# Patient Record
Sex: Male | Born: 1945 | Race: White | Hispanic: No | Marital: Single | Smoking: Former smoker
Health system: Southern US, Community
[De-identification: ages and names within clinical notes are randomized; demographics above are authoritative.]

## PROBLEM LIST (undated history)

## (undated) ENCOUNTER — Emergency Department: Admission: EM | Source: Home / Self Care

## (undated) DIAGNOSIS — I2510 Atherosclerotic heart disease of native coronary artery without angina pectoris: Secondary | ICD-10-CM

## (undated) DIAGNOSIS — M26621 Arthralgia of right temporomandibular joint: Secondary | ICD-10-CM

## (undated) DIAGNOSIS — C4490 Unspecified malignant neoplasm of skin, unspecified: Secondary | ICD-10-CM

## (undated) DIAGNOSIS — C61 Malignant neoplasm of prostate: Secondary | ICD-10-CM

## (undated) DIAGNOSIS — R05 Cough: Secondary | ICD-10-CM

## (undated) DIAGNOSIS — E785 Hyperlipidemia, unspecified: Secondary | ICD-10-CM

## (undated) DIAGNOSIS — J042 Acute laryngotracheitis: Secondary | ICD-10-CM

## (undated) DIAGNOSIS — H9190 Unspecified hearing loss, unspecified ear: Secondary | ICD-10-CM

## (undated) DIAGNOSIS — E279 Disorder of adrenal gland, unspecified: Secondary | ICD-10-CM

## (undated) DIAGNOSIS — R41840 Attention and concentration deficit: Secondary | ICD-10-CM

## (undated) DIAGNOSIS — T8859XA Other complications of anesthesia, initial encounter: Secondary | ICD-10-CM

## (undated) DIAGNOSIS — K2970 Gastritis, unspecified, without bleeding: Secondary | ICD-10-CM

## (undated) DIAGNOSIS — M21371 Foot drop, right foot: Secondary | ICD-10-CM

## (undated) DIAGNOSIS — C801 Malignant (primary) neoplasm, unspecified: Secondary | ICD-10-CM

## (undated) DIAGNOSIS — K219 Gastro-esophageal reflux disease without esophagitis: Secondary | ICD-10-CM

## (undated) DIAGNOSIS — Z131 Encounter for screening for diabetes mellitus: Secondary | ICD-10-CM

## (undated) DIAGNOSIS — N200 Calculus of kidney: Secondary | ICD-10-CM

## (undated) DIAGNOSIS — I10 Essential (primary) hypertension: Secondary | ICD-10-CM

## (undated) DIAGNOSIS — G4730 Sleep apnea, unspecified: Secondary | ICD-10-CM

## (undated) DIAGNOSIS — T4145XA Adverse effect of unspecified anesthetic, initial encounter: Secondary | ICD-10-CM

## (undated) DIAGNOSIS — R059 Cough, unspecified: Secondary | ICD-10-CM

## (undated) HISTORY — DX: Unspecified hearing loss, unspecified ear: H91.90

## (undated) HISTORY — DX: Acute laryngotracheitis: J04.2

## (undated) HISTORY — DX: Hyperlipidemia, unspecified: E78.5

## (undated) HISTORY — PX: OTHER SURGICAL HISTORY: SHX169

## (undated) HISTORY — DX: Essential (primary) hypertension: I10

## (undated) HISTORY — DX: Sleep apnea, unspecified: G47.30

## (undated) HISTORY — DX: Gastritis, unspecified, without bleeding: K29.70

## (undated) HISTORY — DX: Unspecified malignant neoplasm of skin, unspecified: C44.90

## (undated) HISTORY — DX: Atherosclerotic heart disease of native coronary artery without angina pectoris: I25.10

## (undated) HISTORY — DX: Arthralgia of right temporomandibular joint: M26.621

## (undated) HISTORY — DX: Disorder of adrenal gland, unspecified: E27.9

## (undated) HISTORY — DX: Encounter for screening for diabetes mellitus: Z13.1

## (undated) HISTORY — DX: Attention and concentration deficit: R41.840

---
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
## 1975-11-13 HISTORY — PX: CERVICAL LAMINECTOMY: SHX94

## 1985-11-12 HISTORY — PX: CYSTOSCOPY: SUR368

## 1996-11-12 HISTORY — PX: CARDIAC CATHETERIZATION: SHX172

## 2003-11-13 DIAGNOSIS — C801 Malignant (primary) neoplasm, unspecified: Secondary | ICD-10-CM

## 2003-11-13 HISTORY — DX: Malignant (primary) neoplasm, unspecified: C80.1

## 2005-05-30 ENCOUNTER — Ambulatory Visit: Payer: Self-pay | Admitting: Family Medicine

## 2005-07-17 ENCOUNTER — Ambulatory Visit: Payer: Self-pay | Admitting: Family Medicine

## 2005-07-18 ENCOUNTER — Ambulatory Visit: Payer: Self-pay | Admitting: Family Medicine

## 2009-11-12 HISTORY — PX: LAMINECTOMY: SHX219

## 2010-05-02 ENCOUNTER — Encounter (INDEPENDENT_AMBULATORY_CARE_PROVIDER_SITE_OTHER): Payer: Self-pay | Admitting: Orthopedic Surgery

## 2010-05-02 ENCOUNTER — Observation Stay (HOSPITAL_COMMUNITY): Admission: RE | Admit: 2010-05-02 | Discharge: 2010-05-03 | Payer: Self-pay | Admitting: Orthopedic Surgery

## 2011-01-04 IMAGING — CR DG CHEST 2V
2 series · 2 of 2 positions shown · non-contrast
Comparison: None.

CLINICAL DATA: Preop for lumbar surgery

CHEST - 2 VIEW

[w chest pa]
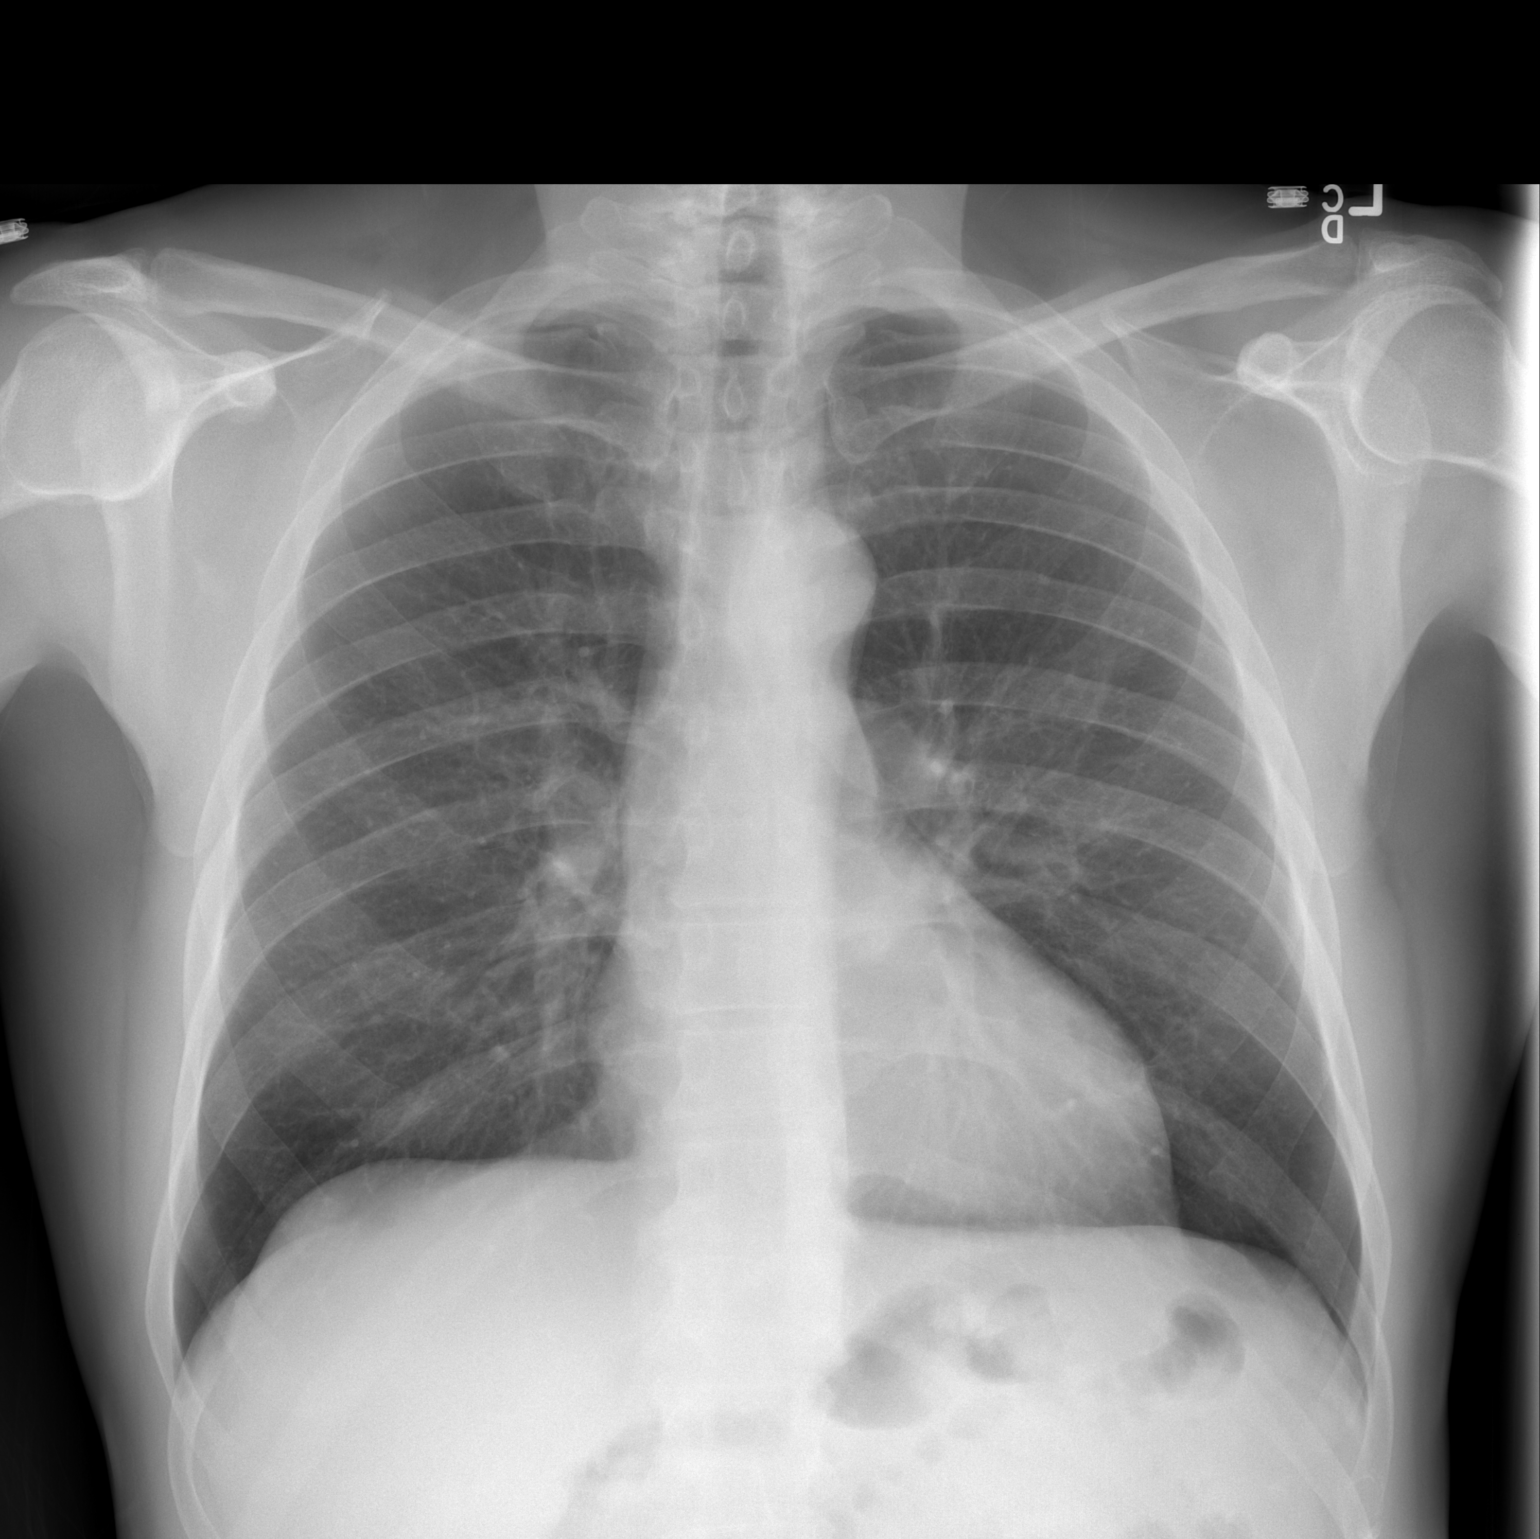

[w chest lat]
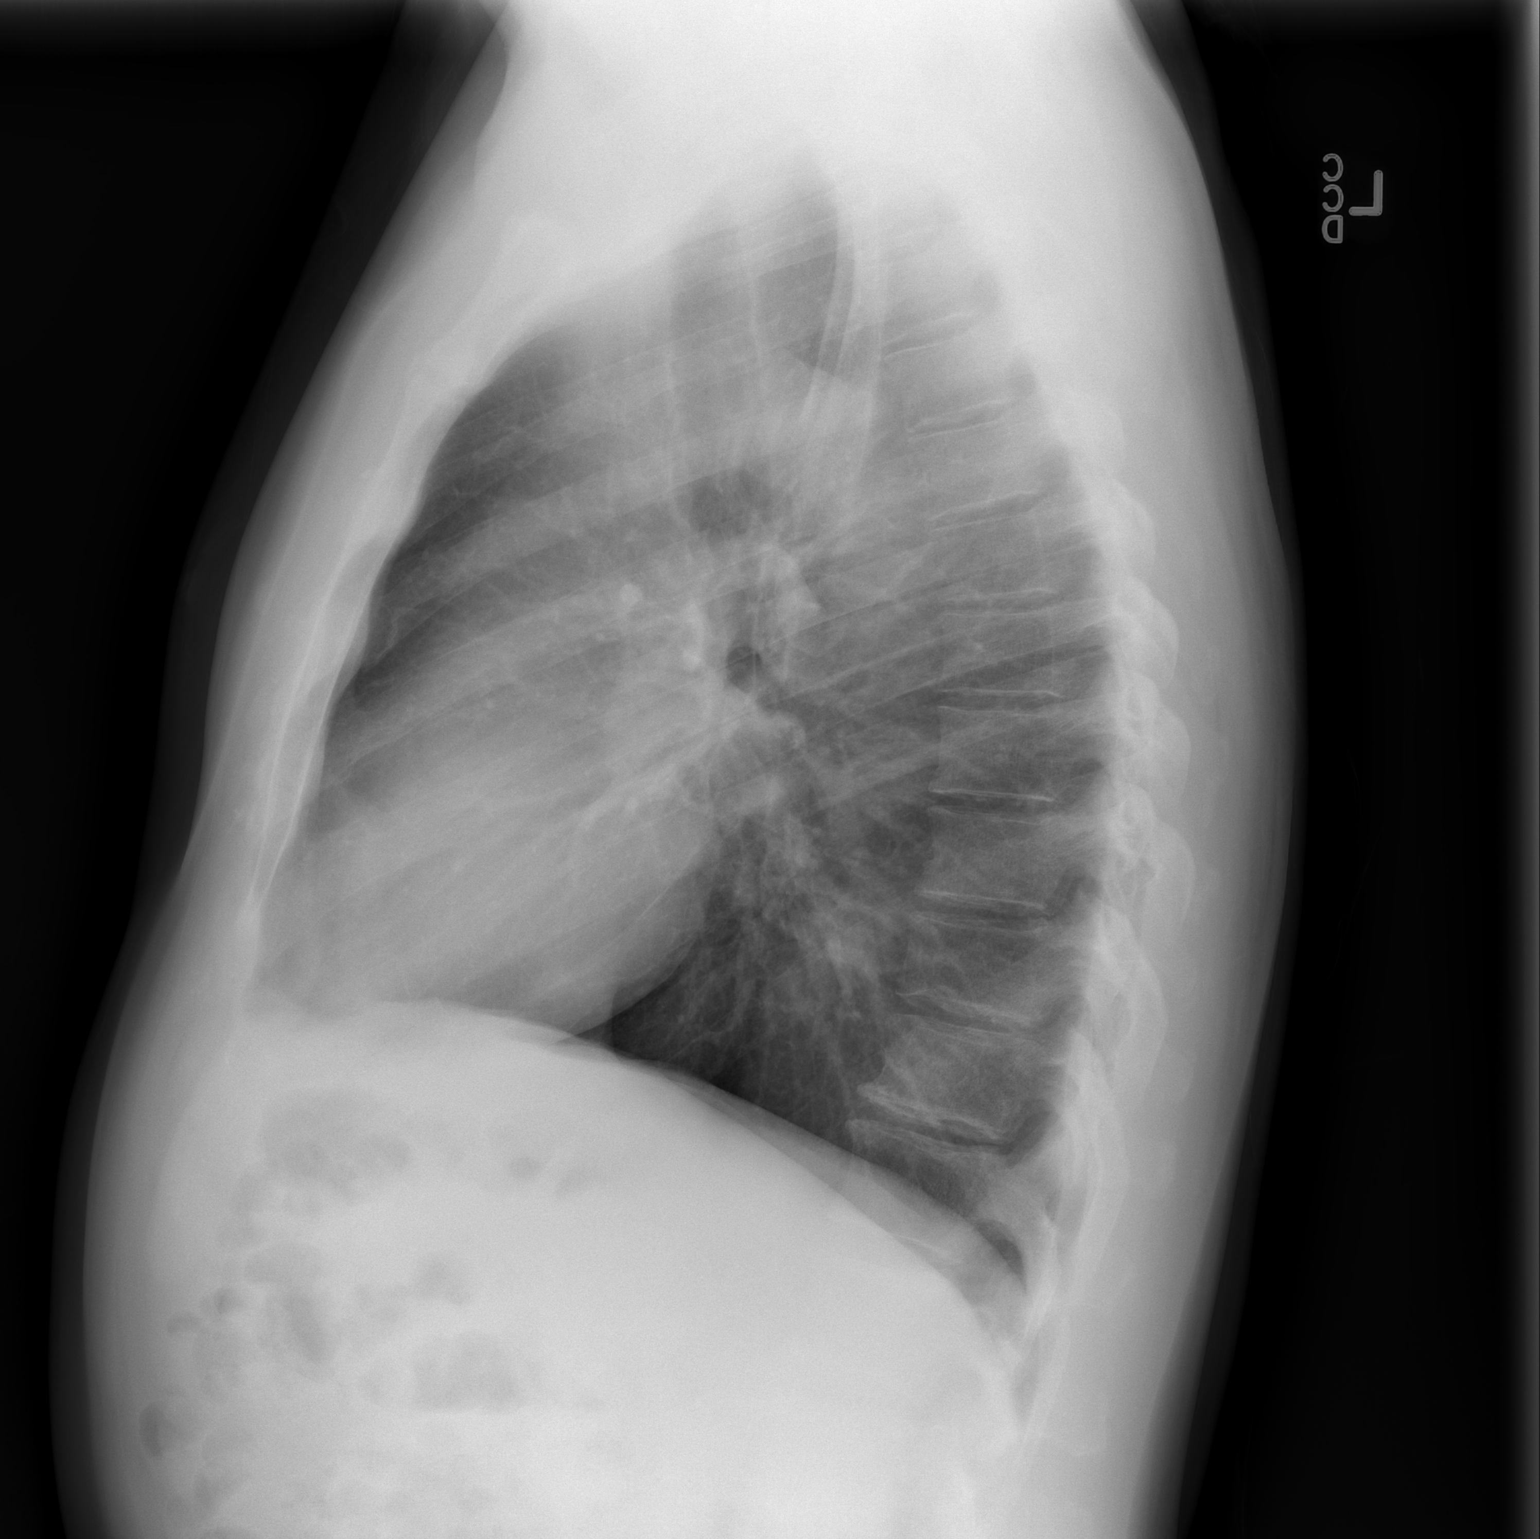

[2 of 2 positions shown; findings below may reference images not displayed]

FINDINGS: Cardiomediastinal silhouette is unremarkable.  No acute
infiltrate or pleural effusion.  No pulmonary edema.  Mild
degenerative changes are noted lower thoracic spine.
IMPRESSION: No active disease.  Mild degenerative changes lower thoracic spine.

## 2011-01-04 IMAGING — CR DG OR PORTABLE SPINE
1 series · 1 of 1 positions shown · non-contrast
Comparison: Portable lateral intraoperative lumbar spine earlier
same date 0046 hours.

CLINICAL DATA: L4-5 disc herniation.  Intraoperative localization.

PORTABLE OPERATIVE SPINE 1 VIEW [DATE]/0866 6898 hours:

[view not recorded]
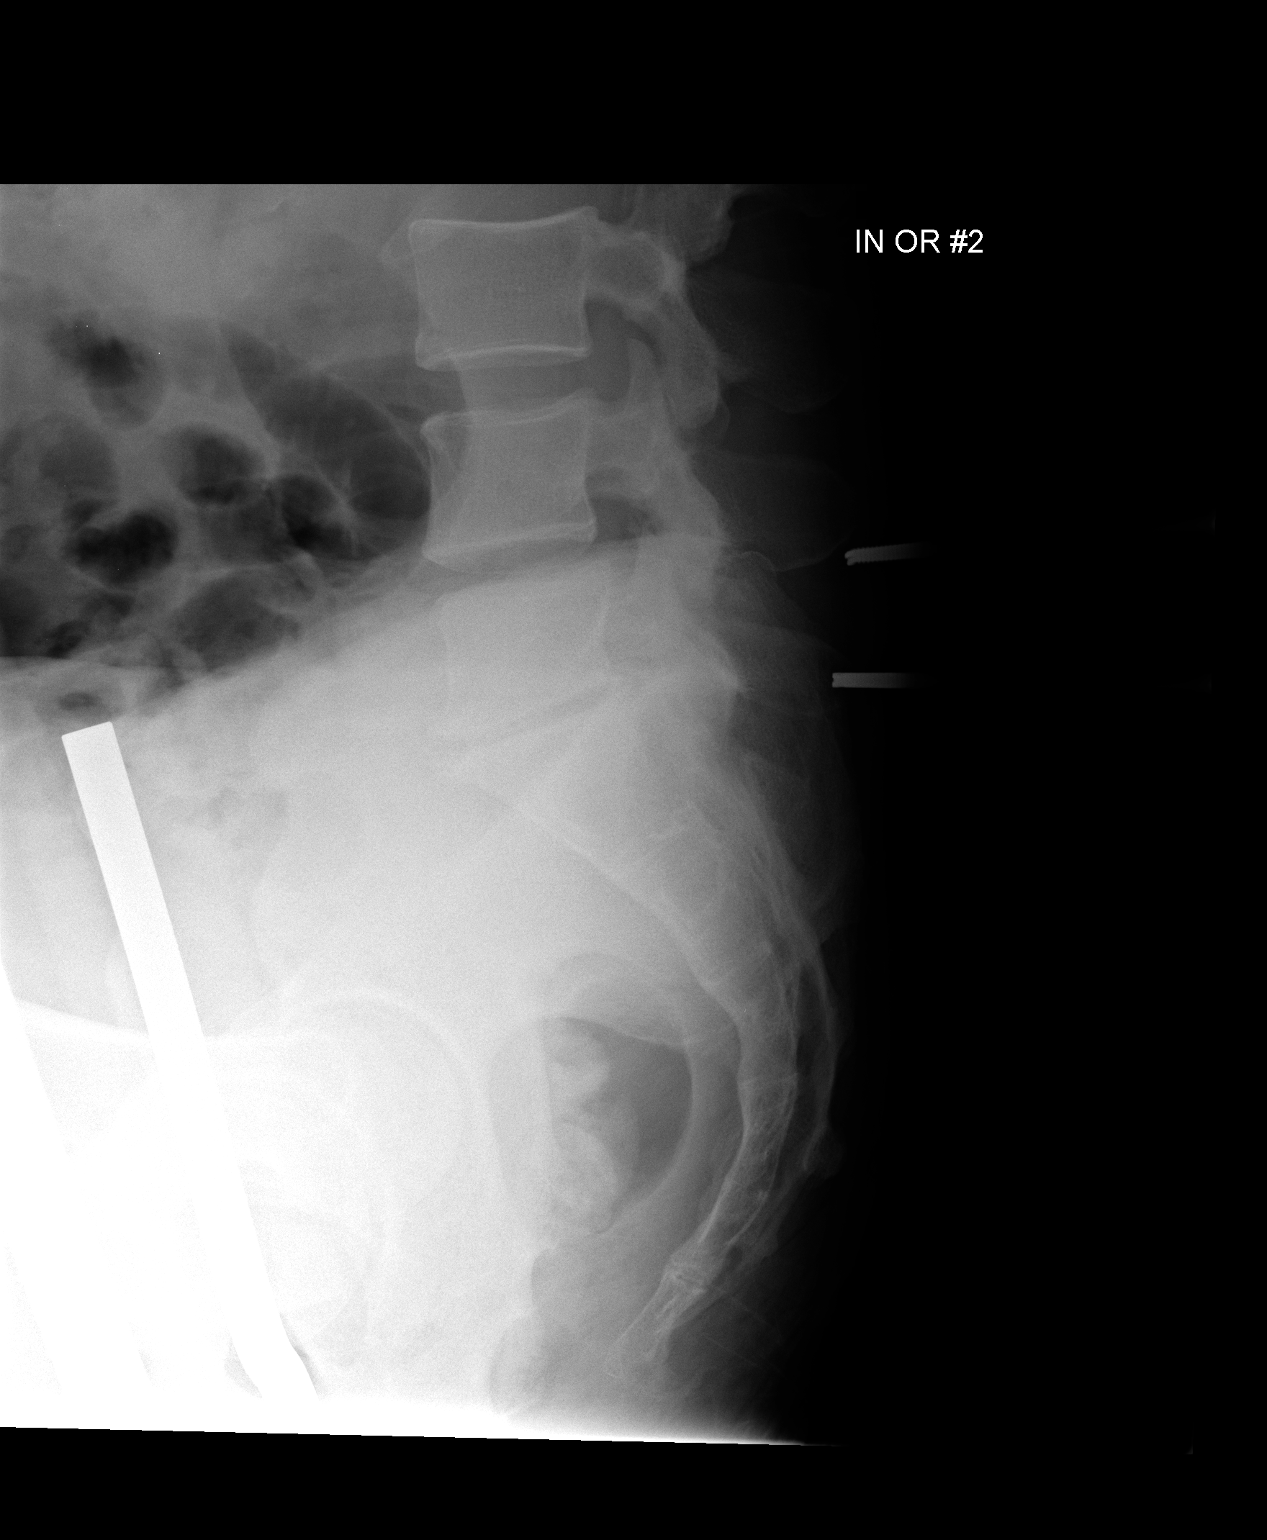

[1 of 1 positions shown; findings below may reference images not displayed]

FINDINGS: Clamps are present on the spinous processes of L4 and L5.
Disc space and endplate hypertrophic changes again noted L5-S1.
IMPRESSION: Clamped on the spinous processes of L4 and L5.

## 2011-01-28 LAB — SURGICAL PCR SCREEN: MRSA, PCR: NEGATIVE

## 2012-03-27 ENCOUNTER — Other Ambulatory Visit: Payer: Self-pay | Admitting: Orthopedic Surgery

## 2012-04-01 ENCOUNTER — Encounter (HOSPITAL_COMMUNITY)
Admission: RE | Admit: 2012-04-01 | Discharge: 2012-04-01 | Disposition: A | Discharge status: Home / Self Care | Payer: PRIVATE HEALTH INSURANCE | Source: Ambulatory Visit | Attending: Orthopedic Surgery | Admitting: Orthopedic Surgery

## 2012-04-01 ENCOUNTER — Ambulatory Visit (HOSPITAL_COMMUNITY)
Admission: RE | Admit: 2012-04-01 | Discharge: 2012-04-01 | Disposition: A | Discharge status: Home / Self Care | Payer: PRIVATE HEALTH INSURANCE | Source: Ambulatory Visit | Attending: Orthopedic Surgery | Admitting: Orthopedic Surgery

## 2012-04-01 ENCOUNTER — Encounter (HOSPITAL_COMMUNITY): Payer: Self-pay

## 2012-04-01 ENCOUNTER — Encounter (HOSPITAL_COMMUNITY): Payer: Self-pay | Admitting: Pharmacy Technician

## 2012-04-01 DIAGNOSIS — Z01810 Encounter for preprocedural cardiovascular examination: Secondary | ICD-10-CM | POA: Insufficient documentation

## 2012-04-01 DIAGNOSIS — Z01812 Encounter for preprocedural laboratory examination: Secondary | ICD-10-CM | POA: Insufficient documentation

## 2012-04-01 DIAGNOSIS — Z01818 Encounter for other preprocedural examination: Secondary | ICD-10-CM | POA: Insufficient documentation

## 2012-04-01 HISTORY — DX: Other complications of anesthesia, initial encounter: T88.59XA

## 2012-04-01 HISTORY — DX: Gastro-esophageal reflux disease without esophagitis: K21.9

## 2012-04-01 HISTORY — DX: Cough, unspecified: R05.9

## 2012-04-01 HISTORY — DX: Cough: R05

## 2012-04-01 HISTORY — DX: Calculus of kidney: N20.0

## 2012-04-01 HISTORY — DX: Malignant (primary) neoplasm, unspecified: C80.1

## 2012-04-01 HISTORY — DX: Adverse effect of unspecified anesthetic, initial encounter: T41.45XA

## 2012-04-01 HISTORY — DX: Foot drop, right foot: M21.371

## 2012-04-01 LAB — CBC
MCH: 32.9 pg (ref 26.0–34.0)
MCHC: 35.1 g/dL (ref 30.0–36.0)
MCV: 93.7 fL (ref 78.0–100.0)
Platelets: 237 10*3/uL (ref 150–400)
RDW: 12.5 % (ref 11.5–15.5)
WBC: 7.2 10*3/uL (ref 4.0–10.5)

## 2012-04-01 LAB — SURGICAL PCR SCREEN: MRSA, PCR: NEGATIVE

## 2012-04-01 NOTE — Patient Instructions (Addendum)
20 Brad Hamilton  04/01/2012   Your procedure is scheduled on:  04-03-2012  Report to [facility] at 145 pm  Call this number if you have problems the morning of surgery: [phone]   Remember:   Do not eat food:After Midnight.   clear liquids midnight until 1015am, then nothing by mouth.  Take these medicines the morning of surgery with A SIP OF WATER: bupropion   Do not wear jewelry or make up.  Do not wear lotions, powders, or perfumes.Do not wear deodorant.    Do not bring valuables to the hospital.  Contacts, dentures or bridgework may not be worn into surgery.  Leave suitcase in the car. After surgery it may be brought to your room.  For patients admitted to the hospital, checkout time is 11:00 AM the day of discharge.     Special Instructions: CHG Shower Use Special Wash: 1/2 bottle night before surgery and 1/2 bottle morning of surgery.neck down avoid private area   Please read over the following fact sheets that you were given: MRSA Information  [facility] pre op nurse phone number [phone], call if needed

## 2012-04-03 ENCOUNTER — Encounter (HOSPITAL_COMMUNITY): Payer: Self-pay | Admitting: Anesthesiology

## 2012-04-03 ENCOUNTER — Ambulatory Visit (HOSPITAL_COMMUNITY): Payer: No Typology Code available for payment source | Admitting: Anesthesiology

## 2012-04-03 ENCOUNTER — Ambulatory Visit (HOSPITAL_COMMUNITY): Payer: No Typology Code available for payment source

## 2012-04-03 ENCOUNTER — Encounter (HOSPITAL_COMMUNITY): Payer: Self-pay | Admitting: *Deleted

## 2012-04-03 ENCOUNTER — Observation Stay (HOSPITAL_COMMUNITY)
Admission: RE | Admit: 2012-04-03 | Discharge: 2012-04-04 | Disposition: A | Discharge status: Home / Self Care | Payer: No Typology Code available for payment source | Source: Ambulatory Visit | Attending: Orthopedic Surgery | Admitting: Orthopedic Surgery

## 2012-04-03 ENCOUNTER — Encounter (HOSPITAL_COMMUNITY)
Admission: RE | Disposition: A | Discharge status: Home / Self Care | Payer: Self-pay | Source: Ambulatory Visit | Attending: Orthopedic Surgery

## 2012-04-03 DIAGNOSIS — Z79899 Other long term (current) drug therapy: Secondary | ICD-10-CM | POA: Insufficient documentation

## 2012-04-03 DIAGNOSIS — M5126 Other intervertebral disc displacement, lumbar region: Principal | ICD-10-CM | POA: Insufficient documentation

## 2012-04-03 DIAGNOSIS — M216X9 Other acquired deformities of unspecified foot: Secondary | ICD-10-CM | POA: Insufficient documentation

## 2012-04-03 DIAGNOSIS — Z7982 Long term (current) use of aspirin: Secondary | ICD-10-CM | POA: Insufficient documentation

## 2012-04-03 DIAGNOSIS — K219 Gastro-esophageal reflux disease without esophagitis: Secondary | ICD-10-CM | POA: Insufficient documentation

## 2012-04-03 DIAGNOSIS — Z9889 Other specified postprocedural states: Secondary | ICD-10-CM

## 2012-04-03 HISTORY — PX: LUMBAR LAMINECTOMY/DECOMPRESSION MICRODISCECTOMY: SHX5026

## 2012-04-03 SURGERY — LUMBAR LAMINECTOMY/DECOMPRESSION MICRODISCECTOMY 1 LEVEL
Anesthesia: General | Site: Back | Laterality: Right | Wound class: Clean

## 2012-04-03 MED ORDER — HYDROMORPHONE HCL PF 1 MG/ML IJ SOLN
INTRAMUSCULAR | Status: AC
  Filled 2012-04-03: qty 1

## 2012-04-03 MED ORDER — MIDAZOLAM HCL 5 MG/5ML IJ SOLN
INTRAMUSCULAR | Status: DC | PRN
  Administered 2012-04-03: 2 mg via INTRAVENOUS

## 2012-04-03 MED ORDER — HYDROMORPHONE 0.3 MG/ML IV SOLN
INTRAVENOUS | Status: AC
  Filled 2012-04-03: qty 25

## 2012-04-03 MED ORDER — PROPOFOL 10 MG/ML IV EMUL
INTRAVENOUS | Status: DC | PRN
  Administered 2012-04-03: 180 mg via INTRAVENOUS

## 2012-04-03 MED ORDER — LACTATED RINGERS IV SOLN
INTRAVENOUS | Status: DC
  Administered 2012-04-03: 20:00:00 via INTRAVENOUS
  Administered 2012-04-03: 1000 mL via INTRAVENOUS

## 2012-04-03 MED ORDER — DIHYDROXYALUMINUM SOD CARB 334 MG PO CHEW: 1.0000 | CHEWABLE_TABLET | ORAL | Status: DC | PRN

## 2012-04-03 MED ORDER — ACETAMINOPHEN 500 MG PO TABS: 1000.0000 mg | ORAL_TABLET | Freq: Three times a day (TID) | ORAL | Status: DC | PRN

## 2012-04-03 MED ORDER — FENTANYL CITRATE 0.05 MG/ML IJ SOLN
INTRAMUSCULAR | Status: DC | PRN
  Administered 2012-04-03 (×5): 50 ug via INTRAVENOUS

## 2012-04-03 MED ORDER — NALOXONE HCL 0.4 MG/ML IJ SOLN: 0.4000 mg | INTRAMUSCULAR | Status: DC | PRN

## 2012-04-03 MED ORDER — CEFAZOLIN SODIUM 1-5 GM-% IV SOLN
1.0000 g | INTRAVENOUS | Status: AC
  Administered 2012-04-03: 1 g via INTRAVENOUS

## 2012-04-03 MED ORDER — ONDANSETRON HCL 4 MG/2ML IJ SOLN: 4.0000 mg | Freq: Four times a day (QID) | INTRAMUSCULAR | Status: DC | PRN

## 2012-04-03 MED ORDER — HYDROMORPHONE HCL PF 1 MG/ML IJ SOLN
0.2500 mg | INTRAMUSCULAR | Status: DC | PRN
  Administered 2012-04-03: 0.5 mg via INTRAVENOUS

## 2012-04-03 MED ORDER — THROMBIN 5000 UNITS EX SOLR
CUTANEOUS | Status: DC | PRN
  Administered 2012-04-03: 19:00:00 via TOPICAL

## 2012-04-03 MED ORDER — CEFAZOLIN SODIUM 1-5 GM-% IV SOLN
1.0000 g | Freq: Once | INTRAVENOUS | Status: AC
  Administered 2012-04-03: 1 g via INTRAVENOUS
  Filled 2012-04-03: qty 50

## 2012-04-03 MED ORDER — LIDOCAINE HCL (CARDIAC) 20 MG/ML IV SOLN
INTRAVENOUS | Status: DC | PRN
  Administered 2012-04-03: 100 mg via INTRAVENOUS

## 2012-04-03 MED ORDER — THROMBIN 5000 UNITS EX SOLR
CUTANEOUS | Status: AC
  Filled 2012-04-03: qty 10000

## 2012-04-03 MED ORDER — ROCURONIUM BROMIDE 100 MG/10ML IV SOLN
INTRAVENOUS | Status: DC | PRN
  Administered 2012-04-03 (×2): 5 mg via INTRAVENOUS
  Administered 2012-04-03: 40 mg via INTRAVENOUS
  Administered 2012-04-03: 5 mg via INTRAVENOUS

## 2012-04-03 MED ORDER — SUCCINYLCHOLINE CHLORIDE 20 MG/ML IJ SOLN
INTRAMUSCULAR | Status: DC | PRN
  Administered 2012-04-03: 100 mg via INTRAVENOUS

## 2012-04-03 MED ORDER — POVIDONE-IODINE 7.5 % EX SOLN: Freq: Once | CUTANEOUS | Status: DC

## 2012-04-03 MED ORDER — CALCIUM CARBONATE ANTACID 500 MG PO CHEW
1.0000 | CHEWABLE_TABLET | ORAL | Status: DC | PRN
  Filled 2012-04-03: qty 1

## 2012-04-03 MED ORDER — 0.9 % SODIUM CHLORIDE (POUR BTL) OPTIME
TOPICAL | Status: DC | PRN
  Administered 2012-04-03: 1000 mL

## 2012-04-03 MED ORDER — DEXAMETHASONE SODIUM PHOSPHATE 10 MG/ML IJ SOLN
INTRAMUSCULAR | Status: DC | PRN
  Administered 2012-04-03: 10 mg via INTRAVENOUS

## 2012-04-03 MED ORDER — NEOSTIGMINE METHYLSULFATE 1 MG/ML IJ SOLN
INTRAMUSCULAR | Status: DC | PRN
  Administered 2012-04-03: 5 mg via INTRAVENOUS

## 2012-04-03 MED ORDER — GLYCOPYRROLATE 0.2 MG/ML IJ SOLN
INTRAMUSCULAR | Status: DC | PRN
  Administered 2012-04-03: 0.2 mg via INTRAVENOUS
  Administered 2012-04-03: .8 mg via INTRAVENOUS

## 2012-04-03 MED ORDER — ACETAMINOPHEN 10 MG/ML IV SOLN
INTRAVENOUS | Status: DC | PRN
  Administered 2012-04-03: 1000 mg via INTRAVENOUS

## 2012-04-03 MED ORDER — VITAMIN C 500 MG PO TABS
500.0000 mg | ORAL_TABLET | Freq: Every day | ORAL | Status: DC
  Administered 2012-04-04: 500 mg via ORAL
  Filled 2012-04-03 (×2): qty 1

## 2012-04-03 MED ORDER — EPHEDRINE SULFATE 50 MG/ML IJ SOLN
INTRAMUSCULAR | Status: DC | PRN
  Administered 2012-04-03: 10 mg via INTRAVENOUS
  Administered 2012-04-03: 5 mg via INTRAVENOUS

## 2012-04-03 MED ORDER — ONDANSETRON HCL 4 MG/2ML IJ SOLN
INTRAMUSCULAR | Status: DC | PRN
  Administered 2012-04-03: 4 mg via INTRAVENOUS

## 2012-04-03 MED ORDER — ACETAMINOPHEN ER 650 MG PO TBCR: 1300.0000 mg | EXTENDED_RELEASE_TABLET | Freq: Three times a day (TID) | ORAL | Status: DC | PRN

## 2012-04-03 MED ORDER — DIPHENHYDRAMINE HCL 50 MG/ML IJ SOLN: 12.5000 mg | Freq: Four times a day (QID) | INTRAMUSCULAR | Status: DC | PRN

## 2012-04-03 MED ORDER — SODIUM CHLORIDE 0.9 % IJ SOLN: 9.0000 mL | INTRAMUSCULAR | Status: DC | PRN

## 2012-04-03 MED ORDER — DIPHENHYDRAMINE HCL 12.5 MG/5ML PO ELIX: 12.5000 mg | ORAL_SOLUTION | Freq: Four times a day (QID) | ORAL | Status: DC | PRN

## 2012-04-03 MED ORDER — HYDROMORPHONE 0.3 MG/ML IV SOLN
INTRAVENOUS | Status: DC
  Administered 2012-04-03: 20:00:00 via INTRAVENOUS

## 2012-04-03 SURGICAL SUPPLY — 39 items
BAG ZIPLOCK 12X15 (MISCELLANEOUS) ×2 IMPLANT
BENZOIN TINCTURE PRP APPL 2/3 (GAUZE/BANDAGES/DRESSINGS) ×2 IMPLANT
BUR EGG ELITE 4.0 (BURR) IMPLANT
CLEANER TIP ELECTROSURG 2X2 (MISCELLANEOUS) ×2 IMPLANT
CLOTH BEACON ORANGE TIMEOUT ST (SAFETY) ×2 IMPLANT
CONT SPEC 4OZ CLIKSEAL STRL BL (MISCELLANEOUS) ×2 IMPLANT
DRAIN PENROSE 18X1/4 LTX STRL (WOUND CARE) IMPLANT
DRAPE MICROSCOPE LEICA (MISCELLANEOUS) ×2 IMPLANT
DRAPE POUCH INSTRU U-SHP 10X18 (DRAPES) ×2 IMPLANT
DRAPE SURG 17X11 SM STRL (DRAPES) ×2 IMPLANT
DRSG ADAPTIC 3X8 NADH LF (GAUZE/BANDAGES/DRESSINGS) ×2 IMPLANT
DRSG PAD ABDOMINAL 8X10 ST (GAUZE/BANDAGES/DRESSINGS) ×2 IMPLANT
DURAPREP 26ML APPLICATOR (WOUND CARE) ×2 IMPLANT
ELECT BLADE TIP CTD 4 INCH (ELECTRODE) ×2 IMPLANT
ELECT REM PT RETURN 9FT ADLT (ELECTROSURGICAL) ×2
ELECTRODE REM PT RTRN 9FT ADLT (ELECTROSURGICAL) ×1 IMPLANT
GLOVE BIO SURGEON STRL SZ8 (GLOVE) ×4 IMPLANT
GLOVE ECLIPSE 8.0 STRL XLNG CF (GLOVE) ×2 IMPLANT
GLOVE INDICATOR 8.0 STRL GRN (GLOVE) ×4 IMPLANT
GOWN STRL REIN XL XLG (GOWN DISPOSABLE) ×2 IMPLANT
KIT BASIN OR (CUSTOM PROCEDURE TRAY) ×2 IMPLANT
KIT POSITIONING SURG ANDREWS (MISCELLANEOUS) ×2 IMPLANT
MANIFOLD NEPTUNE II (INSTRUMENTS) ×2 IMPLANT
NEEDLE SPNL 18GX3.5 QUINCKE PK (NEEDLE) ×6 IMPLANT
NS IRRIG 1000ML POUR BTL (IV SOLUTION) ×2 IMPLANT
PATTIES SURGICAL .5 X.5 (GAUZE/BANDAGES/DRESSINGS) ×4 IMPLANT
PATTIES SURGICAL .75X.75 (GAUZE/BANDAGES/DRESSINGS) ×2 IMPLANT
PATTIES SURGICAL 1X1 (DISPOSABLE) ×2 IMPLANT
POSITIONER SURGICAL ARM (MISCELLANEOUS) ×2 IMPLANT
SPONGE LAP 4X18 X RAY DECT (DISPOSABLE) ×4 IMPLANT
SPONGE SURGIFOAM ABS GEL 100 (HEMOSTASIS) ×2 IMPLANT
STAPLER VISISTAT 35W (STAPLE) ×2 IMPLANT
SUT VIC AB 1 CT1 27 (SUTURE) ×2
SUT VIC AB 1 CT1 27XBRD ANTBC (SUTURE) ×2 IMPLANT
SUT VIC AB 2-0 CT1 27 (SUTURE) ×2
SUT VIC AB 2-0 CT1 27XBRD (SUTURE) ×2 IMPLANT
TOWEL OR 17X26 10 PK STRL BLUE (TOWEL DISPOSABLE) IMPLANT
TRAY LAMINECTOMY (CUSTOM PROCEDURE TRAY) ×2 IMPLANT
WATER STERILE IRR 1500ML POUR (IV SOLUTION) IMPLANT

## 2012-04-03 NOTE — H&P (Signed)
Brad Hamilton is an 66 y.o. male.   Chief Complaint:pain and weakness rt leg/foot HPI: history of microdiscectomy L4-5 left ;now with rt leg pain withw eakness of dorsiflexion foot and extruded disc fragment at L4-5 right  Past Medical History  Diagnosis Date  . Kidney stones 4 yrs ago    passed on own  . GERD (gastroesophageal reflux disease)   . Cancer 2005    skin cancer on abdomen  . Foot drop, right   . Complication of anesthesia     hyper coming out of anesthesia 1987 cystoscopy  . Cough     occasional cough with allergies    Past Surgical History  Procedure Date  . Appendectomy 1958  . Cervical laminectomy 1977  . Sinus surgery x 2 1980's   . Laminectomy 2011  . Cardiac catheterization 1998  . Cystoscopy 1987    History reviewed. No pertinent family history. Social History:  reports that he quit smoking about 11 years ago. He has never used smokeless tobacco. He reports that he drinks about 1.2 ounces of alcohol per week. His drug history not on file.  Allergies: No Known Allergies  Medications Prior to Admission  Medication Sig Dispense Refill  . acetaminophen (TYLENOL) 650 MG CR tablet Take 1,300 mg by mouth every 8 (eight) hours as needed.      Marland Kitchen aspirin EC 81 MG tablet Take 81 mg by mouth daily with breakfast.      . buPROPion (WELLBUTRIN XL) 150 MG 24 hr tablet Take 150 mg by mouth daily with breakfast.      . calcium carbonate-magnesium hydroxide (ROLAIDS) 334 MG CHEW Chew 1 tablet by mouth as needed.      Marland Kitchen ibuprofen (ADVIL,MOTRIN) 200 MG tablet Take 400-600 mg by mouth every 6 (six) hours as needed. For pain      . OVER THE COUNTER MEDICATION Take by mouth daily with breakfast. Alphaloproic Acid=For immune system      . Saw Palmetto 500 MG CAPS Take 500 mg by mouth daily with breakfast.      . vitamin C (ASCORBIC ACID) 500 MG tablet Take 500 mg by mouth daily with breakfast.        No results found for this or any previous visit (from the past 48  hour(s)). No results found.  ROS  Blood pressure 164/92, pulse 60, temperature 97.3 F (36.3 C), temperature source Oral, resp. rate 16, SpO2 98.00%. Physical Exam  Constitutional: He is oriented to person, place, and time. He appears well-developed and well-nourished.  HENT:  Head: Normocephalic and atraumatic.  Right Ear: External ear normal.  Left Ear: External ear normal.  Nose: Nose normal.  Mouth/Throat: Oropharynx is clear and moist.  Eyes: Conjunctivae and EOM are normal. Pupils are equal, round, and reactive to light.  Neck: Normal range of motion. Neck supple.  Cardiovascular: Normal rate, regular rhythm and intact distal pulses.   Respiratory: Effort normal and breath sounds normal.  GI: Soft. Bowel sounds are normal.  Musculoskeletal: Normal range of motion.       Weakness of dorsiflexion rt foot  Neurological: He is alert and oriented to person, place, and time. He has normal reflexes.  Skin: Skin is warm and dry.  Psychiatric: He has a normal mood and affect. His behavior is normal. Judgment and thought content normal.     Assessment/Plan HNP with extruded fragment L4-5 right Microdiscectomy L4-5 rt  [name] P 04/03/2012, 4:11 PM

## 2012-04-03 NOTE — Transfer of Care (Signed)
Immediate Anesthesia Transfer of Care Note  Patient: Brad Hamilton  Procedure(s) Performed: Procedure(s) (LRB): LUMBAR LAMINECTOMY/DECOMPRESSION MICRODISCECTOMY 1 LEVEL (Right)  Patient Location: PACU  Anesthesia Type: General  Level of Consciousness: awake, alert , oriented and patient cooperative  Airway & Oxygen Therapy: Patient Spontanous Breathing and Patient connected to face mask oxygen  Post-op Assessment: Report given to PACU RN, Post -op Vital signs reviewed and stable and Patient moving all extremities  Post vital signs: Reviewed  Complications: No apparent anesthesia complications

## 2012-04-03 NOTE — Brief Op Note (Signed)
04/03/2012  7:15 PM  PATIENT:  Brad Hamilton  66 y.o. male  PRE-OPERATIVE DIAGNOSIS:  Herniated Disc recurrent, L4-5 right  POST-OPERATIVE DIAGNOSIS:  Herniated Disc recurrent L4-5 right  PROCEDURE:  Procedure(s) (LRB): LUMBAR LAMINECTOMY/DECOMPRESSION MICRODISCECTOMY 1 LEVEL (Right) L4-5  SURGEON:  Surgeon(s) and Role:    * Drucilla Schmidt, MD - Primary    * Jacki Cones, MD - Assisting  PHYSICIAN ASSISTANT:   ASSISTANTS: nurse  ANESTHESIA:   general  EBL:  Total I/O In: 800 [I.V.:800] Out: -   BLOOD ADMINISTERED:none  DRAINS: none   LOCAL MEDICATIONS USED:  NONE  SPECIMEN:  Biopsy / Limited Resection  DISPOSITION OF SPECIMEN:  PATHOLOGY  COUNTS:  YES  TOURNIQUET:  * No tourniquets in log *  DICTATION: .Other Dictation: Dictation Number [id number]  PLAN OF CARE: Admit for overnight observation  PATIENT DISPOSITION:  PACU - hemodynamically stable.   Delay start of Pharmacological VTE agent (>24hrs) due to surgical blood loss or risk of bleeding: yes

## 2012-04-03 NOTE — Preoperative (Signed)
Beta Blockers   Reason not to administer Beta Blockers:Not Applicable 

## 2012-04-03 NOTE — Anesthesia Postprocedure Evaluation (Signed)
  Anesthesia Post-op Note  Patient: Brad Hamilton  Procedure(s) Performed: Procedure(s) (LRB): LUMBAR LAMINECTOMY/DECOMPRESSION MICRODISCECTOMY 1 LEVEL (Right)  Patient Location: PACU  Anesthesia Type: General  Level of Consciousness: oriented and sedated  Airway and Oxygen Therapy: Patient Spontanous Breathing and Patient connected to nasal cannula oxygen  Post-op Pain: mild  Post-op Assessment: Post-op Vital signs reviewed, Patient's Cardiovascular Status Stable, Respiratory Function Stable and Patent Airway  Post-op Vital Signs: stable  Complications: No apparent anesthesia complications

## 2012-04-03 NOTE — Anesthesia Preprocedure Evaluation (Signed)
Anesthesia Evaluation  Patient identified by MRN, date of birth, ID band Patient awake    Reviewed: Allergy & Precautions, H&P , NPO status , Patient's Chart, lab work & pertinent test results, reviewed documented beta blocker date and time   Airway Mallampati: II TM Distance: >3 FB Neck ROM: Full    Dental  (+) Teeth Intact   Pulmonary former smoker breath sounds clear to auscultation        Cardiovascular Rhythm:Regular Rate:Normal  Borderline BP, no Rx Denies cardiac symptoms   Neuro/Psych negative neurological ROS  negative psych ROS   GI/Hepatic negative GI ROS, Neg liver ROS,   Endo/Other  negative endocrine ROS  Renal/GU negative Renal ROS  negative genitourinary   Musculoskeletal   Abdominal   Peds negative pediatric ROS (+)  Hematology negative hematology ROS (+)   Anesthesia Other Findings   Reproductive/Obstetrics negative OB ROS                           Anesthesia Physical Anesthesia Plan  ASA: II  Anesthesia Plan: General   Post-op Pain Management:    Induction: Intravenous  Airway Management Planned: Oral ETT  Additional Equipment:   Intra-op Plan:   Post-operative Plan: Extubation in OR  Informed Consent: I have reviewed the patients History and Physical, chart, labs and discussed the procedure including the risks, benefits and alternatives for the proposed anesthesia with the patient or authorized representative who has indicated his/her understanding and acceptance.   Dental advisory given  Plan Discussed with: CRNA and Surgeon  Anesthesia Plan Comments:         Anesthesia Quick Evaluation

## 2012-04-04 ENCOUNTER — Encounter (HOSPITAL_COMMUNITY): Payer: Self-pay | Admitting: Orthopedic Surgery

## 2012-04-04 MED ORDER — METHOCARBAMOL 500 MG PO TABS: 500.0000 mg | ORAL_TABLET | Freq: Four times a day (QID) | ORAL | Status: AC

## 2012-04-04 MED ORDER — HYDROCODONE-ACETAMINOPHEN 7.5-325 MG PO TABS: 1.0000 | ORAL_TABLET | Freq: Four times a day (QID) | ORAL | Status: AC | PRN

## 2012-04-04 MED ORDER — HYDROCODONE-ACETAMINOPHEN 7.5-325 MG PO TABS
1.0000 | ORAL_TABLET | Freq: Four times a day (QID) | ORAL | Status: DC | PRN
  Administered 2012-04-04: 1 via ORAL
  Filled 2012-04-04: qty 1

## 2012-04-04 NOTE — Progress Notes (Signed)
Patient ID: Brad Hamilton, male   DOB: July 31, 1946, 66 y.o.   MRN: [id number] Pre-op leg pain gone and he now has good dorsiflexion of foot.  Able to urinate.  Will try him on oral pain med and possibly discharge later today.

## 2012-04-04 NOTE — Care Management Note (Signed)
    Page 1 of 2   04/04/2012     1:49:02 PM   CARE MANAGEMENT NOTE 04/04/2012  Patient:  Brad Hamilton, Brad Hamilton   Account Number:  [id number]  Date Initiated:  04/04/2012  Documentation initiated by:  Colleen Can  Subjective/Objective Assessment:   dx herniated disc; laminectomy/decompression l4-l5     Action/Plan:   Cm spoke with patient. Plans are for patient to discharge to home with family member as caregiver. States he already has RW and will not need HH services. No HH orders   Anticipated DC Date:  04/04/2012   Anticipated DC Plan:  HOME/SELF CARE  In-house referral  NA      DC Planning Services  CM consult      PAC Choice  NA   Choice offered to / List presented to:  NA   DME arranged  NA      DME agency  NA     HH arranged  NA      HH agency  NA   Status of service:  Completed, signed off Medicare Important Message given?  NA - LOS <3 / Initial given by admissions (If response is "NO", the following Medicare IM given date fields will be blank) Date Medicare IM given:   Date Additional Medicare IM given:    Discharge Disposition:  HOME/SELF CARE  Per UR Regulation:    If discussed at Long Length of Stay Meetings, dates discussed:    Comments:

## 2012-04-04 NOTE — Progress Notes (Signed)
Utilization review completed.  

## 2012-04-04 NOTE — Op Note (Signed)
NAMESYLVAIN, HASTEN NO.:  [id number]  MEDICAL RECORD NO.:  [id number]  LOCATION:  1612                         FACILITY:  [facility]  PHYSICIAN:  Marlowe Kays, M.D.  DATE OF BIRTH:  07/22/1946  DATE OF PROCEDURE: DATE OF DISCHARGE:  04/04/2012                              OPERATIVE REPORT   PREOPERATIVE DIAGNOSIS:  Recurrent herniated nucleus pulposus, L4-5, right.  POSTOPERATIVE DIAGNOSIS:  Recurrent herniated nucleus pulposus, L4-5, right.  OPERATION:  Wide decompressive laminectomy and microdiskectomy, L4-5 right.  SURGEON:  Marlowe Kays, M.D.  ASSISTANT:  Georges Lynch. Darrelyn Hillock, M.D.  ANESTHESIA:  General.  PATHOLOGY AND JUSTIFICATION FOR PROCEDURE:  He has had a prior microdiskectomy at L4-5 on the left.  Because of recent onset of severe right leg pain associated with some weakness of dorsiflexion of his foot, an MRI was performed on 03/17/2012, with moderate central and right side extrusion displacing the right L5 nerve root.  Accordingly, he is here today for the above-mentioned surgery.  PROCEDURE IN DETAIL:  Prophylactic antibiotics, satisfactory general anesthesia, prone position on the Andrews frame, back was prepped with DuraPrep, draped in sterile field.  Time-out was performed.  IV employed.  I went through the old surgical incision and tagged the two spinous processes in the center of the wound with Kocher clamps,  and lateral x-ray confirmed that we were at L4 and L5.  The location of the L4-5 disk was also noted.  I then dissected soft tissue partly off the lamina of L4 and L5 on the left, but also completely dissecting out the lamina of L4-L5 on the right.  Self-retaining McCullough retractors were inserted.  We immediately found that he had a significant amount of shingling.  There had been some settling of the L4-5 disk.  After tamping to get beneath the adjacent lamina, we elected to perform a very small central decompression on  the right side, which gave [place] better access to the anatomy.  Undermining both the underneath surface of L4 and superior portion of L5 with small curette, I then proceeded with decompression using 2-and 3 mm Kerrison rongeurs.  We kept looking distally using several x-rays to finally locate the L4-5 disk.  He had usually broad L5 lamina.  The disk was ultimately located and the L5 nerve root retracted medialward.  We identified the interspace with a spinal needle and then decompressed slightly more distalward because of osteophytes at the L4-5 disk.  I entered the disk space with 15 knife blade and removed a good bit of disk material, particularly laterally beneath the foramen as noted on the MRI.  All disk material obtainable was removed with combination of nerve hook, Epstein curette and micropituitary.  When we had finally completed the diskectomy and decompression, the nerve root was lying free in the foramen and was freely mobile.  I irrigated the wound well with sterile saline and placed Gelfoam soaked in thrombin over the interspace and around the dura.  Self-retaining retractors were removed.  The wound was closed in layers with interrupted #1 Vicryl and the fascia leaving a cm aperture distally for any egress of fluid.  Subcutaneous tissue was closed with 2-0 Vicryl, staples for  the skin.  Betadine, Adaptic, and dry sterile dressing were applied.  He was gently placed on PACU bed and taken in satisfactory condition with no known complications and minimal blood loss.          ______________________________ Marlowe Kays, M.D.     JA/MEDQ  D:  04/03/2012  T:  04/04/2012  Job:  [id number]

## 2012-04-04 NOTE — Progress Notes (Signed)
Patient given prescriptions and discharge information. Questions answered. Taken by wheelchair to personal vehicle.

## 2012-04-05 NOTE — Discharge Summary (Signed)
Brad Hamilton, Brad Hamilton NO.:  [id number]  MEDICAL RECORD NO.:  [id number]  LOCATION:  1612                         FACILITY:  [facility]  PHYSICIAN:  Marlowe Kays, M.D.  DATE OF BIRTH:  November 05, 1946  DATE OF ADMISSION:  04/03/2012 DATE OF DISCHARGE:  04/04/2012                              DISCHARGE SUMMARY   ADMISSION DIAGNOSIS:  Recurrent herniated nucleus pulposus L4-5.  DISCHARGE DIAGNOSIS:  Recurrent herniated nucleus pulposus L4-5.  OPERATION:  Decompressive laminectomy and microdiskectomy L4-5 right.  SURGEON:  Marlowe Kays, M.D.  ASSISTANT:  Georges Lynch. Darrelyn Hillock, M.D.  HISTORY:  Several years ago I performed a microdiskectomy at L4-5 on the left and he did well until recently when he developed right leg pain associated with slapping of his right foot.  An MRI demonstrated a recurrent disk herniation but this time on the right at L4-5 with what appeared to be extruded fragment.  Accordingly he was admitted at this time for the above surgery.  Admission laboratory data was unremarkable. Postop course was unremarkable.  At the time of discharge he was ambulating well, the leg pain and slapping of the foot had completely resolved.  He was discharged on his home medications as well as Robaxin 500 mg #30 and Norco 7.5 mg #30.  Instructions were given, return to my office in 2 weeks from surgery or sooner with any complications.          ______________________________ Marlowe Kays, M.D.     JA/MEDQ  D:  04/04/2012  T:  04/05/2012  Job:  [id number]

## 2014-05-20 HISTORY — PX: COLONOSCOPY: SHX174

## 2016-03-05 DIAGNOSIS — I10 Essential (primary) hypertension: Secondary | ICD-10-CM | POA: Insufficient documentation

## 2016-03-05 HISTORY — DX: Essential (primary) hypertension: I10

## 2016-04-17 DIAGNOSIS — I2510 Atherosclerotic heart disease of native coronary artery without angina pectoris: Secondary | ICD-10-CM | POA: Insufficient documentation

## 2016-04-17 DIAGNOSIS — I10 Essential (primary) hypertension: Secondary | ICD-10-CM

## 2016-04-17 HISTORY — DX: Essential (primary) hypertension: I10

## 2016-04-17 HISTORY — DX: Atherosclerotic heart disease of native coronary artery without angina pectoris: I25.10

## 2016-04-19 DIAGNOSIS — Z131 Encounter for screening for diabetes mellitus: Secondary | ICD-10-CM | POA: Insufficient documentation

## 2016-04-19 HISTORY — DX: Encounter for screening for diabetes mellitus: Z13.1

## 2017-03-20 DIAGNOSIS — J042 Acute laryngotracheitis: Secondary | ICD-10-CM

## 2017-03-20 DIAGNOSIS — R41840 Attention and concentration deficit: Secondary | ICD-10-CM | POA: Insufficient documentation

## 2017-03-20 HISTORY — DX: Acute laryngotracheitis: J04.2

## 2017-03-20 HISTORY — DX: Attention and concentration deficit: R41.840

## 2017-06-22 ENCOUNTER — Other Ambulatory Visit: Payer: Self-pay | Admitting: Cardiology

## 2017-06-24 NOTE — Telephone Encounter (Signed)
Left message to return call to discuss if patient will be following Dr. Bettina Gavia in [facility].

## 2017-06-25 ENCOUNTER — Other Ambulatory Visit: Payer: Self-pay

## 2017-06-25 MED ORDER — DILTIAZEM HCL ER COATED BEADS 120 MG PO CP24: 120.0000 mg | ORAL_CAPSULE | Freq: Every day | ORAL | 1 refills | Status: DC

## 2017-06-28 DIAGNOSIS — M26621 Arthralgia of right temporomandibular joint: Secondary | ICD-10-CM

## 2017-06-28 HISTORY — DX: Arthralgia of right temporomandibular joint: M26.621

## 2017-07-18 DIAGNOSIS — C4490 Unspecified malignant neoplasm of skin, unspecified: Secondary | ICD-10-CM

## 2017-07-18 DIAGNOSIS — K2970 Gastritis, unspecified, without bleeding: Secondary | ICD-10-CM

## 2017-07-18 DIAGNOSIS — H9190 Unspecified hearing loss, unspecified ear: Secondary | ICD-10-CM

## 2017-07-18 DIAGNOSIS — E279 Disorder of adrenal gland, unspecified: Secondary | ICD-10-CM

## 2017-07-18 DIAGNOSIS — E278 Other specified disorders of adrenal gland: Secondary | ICD-10-CM

## 2017-07-18 DIAGNOSIS — G4730 Sleep apnea, unspecified: Secondary | ICD-10-CM | POA: Insufficient documentation

## 2017-07-18 DIAGNOSIS — E785 Hyperlipidemia, unspecified: Secondary | ICD-10-CM

## 2017-07-18 HISTORY — DX: Unspecified malignant neoplasm of skin, unspecified: C44.90

## 2017-07-18 HISTORY — DX: Other specified disorders of adrenal gland: E27.8

## 2017-07-18 HISTORY — DX: Gastritis, unspecified, without bleeding: K29.70

## 2017-07-18 HISTORY — DX: Sleep apnea, unspecified: G47.30

## 2017-07-18 HISTORY — DX: Hyperlipidemia, unspecified: E78.5

## 2017-07-18 HISTORY — DX: Disorder of adrenal gland, unspecified: E27.9

## 2017-07-18 HISTORY — DX: Unspecified hearing loss, unspecified ear: H91.90

## 2017-08-12 ENCOUNTER — Other Ambulatory Visit: Payer: Self-pay | Admitting: Cardiology

## 2017-08-12 NOTE — Progress Notes (Signed)
Cardiology Office Note:    Date:  08/14/2017   ID:  [name], DOB January 30, 1946, MRN [id number]  PCP:  Algis Greenhouse, MD  Cardiologist:  Shirlee More, MD    Referring MD: Algis Greenhouse, MD    ASSESSMENT:    1. Essential hypertension   2. Hyperlipidemia, unspecified hyperlipidemia type   3. Neck pain on left side   4. Coronary artery calcification seen on CT scan    PLAN:    In order of problems listed above:  1. Stable continue current treatment, check renal function with ACE inhibitor 2. Stable continue statin check liver function for safety and lipid profile for efficacy 3. He is concern regarding carotid artery disease check duplex 4. Stable continue antihypertensive and lipid-lowering treatment. At this time he doesn't need an ischemia evaluation   Next appointment: 6 months   Medication Adjustments/Labs and Tests Ordered: Current medicines are reviewed at length with the patient today.  Concerns regarding medicines are outlined above.  Orders Placed This Encounter  Procedures  . Comprehensive Metabolic Panel (CMET)  . Lipid Profile   No orders of the defined types were placed in this encounter.   Chief Complaint  Patient presents with  . Follow-up    No Concerns     History of Present Illness:    Brad Hamilton is a 71 y.o. male with a hx of Dyslipidemia, HTN, and coronary artery calcification on CT   last seen more than 1 year ago.recently he's been having pain in the left neck and is concerned regarding carotid stenosis. He's had no visual loss or TIA symptoms. He's had no anginal discomfort shortness of breath exercise intolerance or syncope.. Compliance with diet, lifestyle and medications:  yes Past Medical History:  Diagnosis Date  . Acute laryngotracheitis 03/20/2017   Overview:  2018  . 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 Arthralgia of right temporomandibular joint 06/28/2017   Overview:  2018  . 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 Benign  hypertension 03/05/2016  . Cancer ([place]) 2005   skin cancer on abdomen  . Complication of anesthesia    hyper coming out of anesthesia 1987 cystoscopy  . Coronary artery calcification seen on CT scan 04/17/2016  . Cough    occasional cough with allergies  . Essential hypertension 04/17/2016  . Foot drop, right   . Gastritis 07/18/2017  . GERD (gastroesophageal reflux disease)   . Hearing loss 07/18/2017  . Hyperlipidemia 07/18/2017  . Kidney stones 4 yrs ago   passed on own  . Screening for diabetes mellitus (DM) 04/19/2016  . Skin cancer 07/18/2017  . Sleep apnea 07/18/2017    Past Surgical History:  Procedure Laterality Date  . APPENDECTOMY  1958  . CARDIAC CATHETERIZATION  1998  . CERVICAL LAMINECTOMY  1977  . CYSTOSCOPY  1987  . LAMINECTOMY  2011  . LUMBAR LAMINECTOMY/DECOMPRESSION MICRODISCECTOMY  04/03/2012   Procedure: LUMBAR LAMINECTOMY/DECOMPRESSION MICRODISCECTOMY 1 LEVEL;  Surgeon: Magnus Sinning, MD;  Location: [facility];  Service: Orthopedics;  Laterality: Right;  Microdiscectomy L4 - L5 on the Right (X-Ray)   . sinus surgery x 2 1980's      Current Medications: Current Meds  Medication Sig  . ANDROGEL PUMP 20.25 MG/ACT (1.62%) GEL Apply 1 application topically 2 (two) times daily.  Marland Kitchen aspirin EC 81 MG tablet Take 81 mg by mouth daily with breakfast.  . buPROPion (WELLBUTRIN XL) 150 MG 24 hr tablet Take 150 mg by mouth daily with breakfast.  .  calcium carbonate-magnesium hydroxide (ROLAIDS) 334 MG CHEW Chew 1 tablet by mouth as needed.  Marland Kitchen CIALIS 20 MG tablet Take 20 mg by mouth daily as needed.  . cloNIDine (CATAPRES) 0.1 MG tablet Take 0.1 mg by mouth daily as needed.  . diltiazem (CARDIZEM CD) 120 MG 24 hr capsule Take 1 capsule (120 mg total) by mouth daily. (Patient taking differently: Take 120 mg by mouth 2 (two) times daily. )  . ibuprofen (ADVIL,MOTRIN) 200 MG tablet Take 400-600 mg by mouth every 6 (six) hours as needed. For pain  . LIVALO 1 MG TABS TAKE 1 TABLET BY  MOUTH 2 DAYS A WEEK AS DIRECTED  . olmesartan (BENICAR) 20 MG tablet Take 20 mg by mouth daily.  Marland Kitchen OVER THE COUNTER MEDICATION Take by mouth daily with breakfast. Alphaloproic Acid=For immune system  . Saw Palmetto 500 MG CAPS Take 500 mg by mouth daily with breakfast.  . vitamin C (ASCORBIC ACID) 500 MG tablet Take 500 mg by mouth daily with breakfast.  . zinc gluconate 50 MG tablet Take 50 mg by mouth daily.     Allergies:   Statins   Social History   Social History  . Marital status: Single    Spouse name: N/A  . Number of children: N/A  . Years of education: N/A   Social History Main Topics  . Smoking status: Former Smoker    Packs/day: 2.00    Years: 30.00    Quit date: 07/12/2000  . Smokeless tobacco: Never Used  . Alcohol use 1.2 oz/week    1 Glasses of wine, 1 Cans of beer per week     Comment: 1 beer or wine per day  . Drug use: Unknown  . Sexual activity: Not on file   Other Topics Concern  . Not on file   Social History Narrative  . No narrative on file     Family History: The patient's family history includes Bleeding Disorder in his mother; Cancer in his maternal grandmother and mother; Diabetes in his father and mother; Heart failure in his father; Hypertension in his father, mother, and sister; Prostate cancer in his father; Stroke in his maternal grandfather. ROS:   Please see the history of present illness.    All other systems reviewed and are negative.  EKGs/Labs/Other Studies Reviewed:    The following studies were reviewed today:  Recent Labs: 03/20/17 Chol 193, HDL 54 LDL 111 No results found for requested labs within last 8760 hours.  Recent Lipid Panel No results found for: CHOL, TRIG, HDL, CHOLHDL, VLDL, LDLCALC, LDLDIRECT  Physical Exam:    VS:  BP (!) 148/72   Pulse 60   Ht 5\' 6"  (1.676 m)   Wt 167 lb 0.6 oz (75.8 kg)   SpO2 98%   BMI 26.96 kg/m     Wt Readings from Last 3 Encounters:  08/14/17 167 lb 0.6 oz (75.8 kg)  04/03/12  166 lb 14.4 oz (75.7 kg)  04/01/12 166 lb 14.4 oz (75.7 kg)     GEN:  Well nourished, well developed in no acute distress HEENT: Normal NECK: No JVD; No carotid bruits LYMPHATICS: No lymphadenopathy CARDIAC:RRR, no murmurs, rubs, gallops RESPIRATORY:  Clear to auscultation without rales, wheezing or rhonchi  ABDOMEN: Soft, non-tender, non-distended MUSCULOSKELETAL:  No edema; No deformity  SKIN: Warm and dry NEUROLOGIC:  Alert and oriented x 3 PSYCHIATRIC:  Normal affect    Signed, Shirlee More, MD  08/14/2017 12:43 PM    [facility]

## 2017-08-14 ENCOUNTER — Encounter: Payer: Self-pay | Admitting: Cardiology

## 2017-08-14 ENCOUNTER — Ambulatory Visit: Payer: Medicare Other | Admitting: Cardiology

## 2017-08-14 ENCOUNTER — Ambulatory Visit (INDEPENDENT_AMBULATORY_CARE_PROVIDER_SITE_OTHER): Payer: PRIVATE HEALTH INSURANCE | Admitting: Cardiology

## 2017-08-14 VITALS — BP 148/72 | HR 60 | Ht 66.0 in | Wt 167.0 lb

## 2017-08-14 DIAGNOSIS — I2510 Atherosclerotic heart disease of native coronary artery without angina pectoris: Secondary | ICD-10-CM | POA: Diagnosis not present

## 2017-08-14 DIAGNOSIS — I10 Essential (primary) hypertension: Secondary | ICD-10-CM

## 2017-08-14 DIAGNOSIS — M542 Cervicalgia: Secondary | ICD-10-CM | POA: Diagnosis not present

## 2017-08-14 DIAGNOSIS — E785 Hyperlipidemia, unspecified: Secondary | ICD-10-CM | POA: Diagnosis not present

## 2017-08-14 NOTE — Patient Instructions (Addendum)
Medication Instructions:  Your physician recommends that you continue on your current medications as directed. Please refer to the Current Medication list given to you today.   Labwork: Your physician recommends that you have lab work today CMP and Lipids   Testing/Procedures: Your physician has requested that you have a carotid duplex. This test is an ultrasound of the carotid arteries in your neck. It looks at blood flow through these arteries that supply the brain with blood. Allow one hour for this exam. There are no restrictions or special instructions.   Follow-Up: Your physician wants you to follow-up in: 6 months.  You will receive a reminder letter in the mail two months in advance. If you don't receive a letter, please call our office to schedule the follow-up appointment.   Any Other Special Instructions Will Be Listed Below (If Applicable).     If you need a refill on your cardiac medications before your next appointment, please call your pharmacy.

## 2017-08-15 LAB — COMPREHENSIVE METABOLIC PANEL
A/G RATIO: 2.9 — AB (ref 1.2–2.2)
ALT: 21 IU/L (ref 0–44)
AST: 21 IU/L (ref 0–40)
Albumin: 4.7 g/dL (ref 3.5–4.8)
Alkaline Phosphatase: 105 IU/L (ref 39–117)
BUN/Creatinine Ratio: 13 (ref 10–24)
BUN: 14 mg/dL (ref 8–27)
Bilirubin Total: 0.5 mg/dL (ref 0.0–1.2)
CALCIUM: 9.5 mg/dL (ref 8.6–10.2)
CO2: 24 mmol/L (ref 20–29)
Chloride: 106 mmol/L (ref 96–106)
Creatinine, Ser: 1.12 mg/dL (ref 0.76–1.27)
GFR, EST AFRICAN AMERICAN: 75 mL/min/{1.73_m2} (ref >59)
GFR, EST NON AFRICAN AMERICAN: 65 mL/min/{1.73_m2} (ref >59)
Globulin, Total: 1.6 g/dL (ref 1.5–4.5)
Glucose: 89 mg/dL (ref 65–99)
POTASSIUM: 4.7 mmol/L (ref 3.5–5.2)
Sodium: 144 mmol/L (ref 134–144)
TOTAL PROTEIN: 6.3 g/dL (ref 6.0–8.5)

## 2017-08-15 LAB — LIPID PANEL
Chol/HDL Ratio: 3.5 ratio (ref 0.0–5.0)
Cholesterol, Total: 190 mg/dL (ref 100–199)
HDL: 54 mg/dL (ref >39)
LDL Calculated: 113 mg/dL — ABNORMAL HIGH (ref 0–99)
Triglycerides: 115 mg/dL (ref 0–149)
VLDL Cholesterol Cal: 23 mg/dL (ref 5–40)

## 2017-08-21 ENCOUNTER — Other Ambulatory Visit: Payer: Self-pay | Admitting: Cardiology

## 2017-09-25 ENCOUNTER — Ambulatory Visit (HOSPITAL_BASED_OUTPATIENT_CLINIC_OR_DEPARTMENT_OTHER)
Admission: RE | Admit: 2017-09-25 | Discharge: 2017-09-25 | Disposition: A | Discharge status: Home / Self Care | Payer: 59 | Source: Ambulatory Visit | Attending: Cardiology | Admitting: Cardiology

## 2017-09-25 DIAGNOSIS — I10 Essential (primary) hypertension: Secondary | ICD-10-CM | POA: Diagnosis present

## 2017-09-25 DIAGNOSIS — I2510 Atherosclerotic heart disease of native coronary artery without angina pectoris: Secondary | ICD-10-CM | POA: Insufficient documentation

## 2017-09-25 LAB — VAS US CAROTID
LCCAPDIAS: 24 cm/s
LCCAPSYS: 139 cm/s
LICADSYS: -83 cm/s
LICAPSYS: -103 cm/s
Right CCA prox dias: 19 cm/s
Right CCA prox sys: 127 cm/s
Right cca dist sys: -81 cm/s

## 2017-09-25 NOTE — Progress Notes (Signed)
Carotid Duplex Bilateral

## 2017-09-26 ENCOUNTER — Ambulatory Visit (HOSPITAL_BASED_OUTPATIENT_CLINIC_OR_DEPARTMENT_OTHER): Payer: PRIVATE HEALTH INSURANCE

## 2017-10-01 ENCOUNTER — Other Ambulatory Visit: Payer: Self-pay | Admitting: Cardiology

## 2017-10-22 DIAGNOSIS — N200 Calculus of kidney: Secondary | ICD-10-CM

## 2017-10-22 DIAGNOSIS — R42 Dizziness and giddiness: Secondary | ICD-10-CM | POA: Insufficient documentation

## 2017-10-22 HISTORY — DX: Dizziness and giddiness: R42

## 2017-10-22 HISTORY — DX: Calculus of kidney: N20.0

## 2018-06-03 DIAGNOSIS — J019 Acute sinusitis, unspecified: Secondary | ICD-10-CM | POA: Insufficient documentation

## 2018-06-03 HISTORY — DX: Acute sinusitis, unspecified: J01.90

## 2018-09-23 ENCOUNTER — Other Ambulatory Visit: Payer: Self-pay | Admitting: Cardiology

## 2018-10-18 ENCOUNTER — Other Ambulatory Visit: Payer: Self-pay | Admitting: Cardiology

## 2018-10-23 ENCOUNTER — Other Ambulatory Visit: Payer: Self-pay | Admitting: Cardiology

## 2018-10-24 ENCOUNTER — Other Ambulatory Visit: Payer: Self-pay | Admitting: *Deleted

## 2018-10-24 MED ORDER — PITAVASTATIN CALCIUM 1 MG PO TABS: ORAL_TABLET | ORAL | 0 refills | Status: DC

## 2018-11-22 ENCOUNTER — Other Ambulatory Visit: Payer: Self-pay | Admitting: Cardiology

## 2018-11-28 ENCOUNTER — Encounter: Payer: Self-pay | Admitting: Cardiology

## 2018-11-28 ENCOUNTER — Other Ambulatory Visit: Payer: Self-pay | Admitting: Cardiology

## 2018-11-28 ENCOUNTER — Ambulatory Visit (INDEPENDENT_AMBULATORY_CARE_PROVIDER_SITE_OTHER): Payer: 59 | Admitting: Cardiology

## 2018-11-28 VITALS — BP 134/78 | Ht 66.0 in | Wt 166.8 lb

## 2018-11-28 DIAGNOSIS — I10 Essential (primary) hypertension: Secondary | ICD-10-CM | POA: Diagnosis not present

## 2018-11-28 DIAGNOSIS — E785 Hyperlipidemia, unspecified: Secondary | ICD-10-CM

## 2018-11-28 NOTE — Progress Notes (Signed)
Cardiology Office Note:    Date:  11/28/2018   ID:  Brad Hamilton, DOB 1946-02-11, MRN [id number]  PCP:  Algis Greenhouse, MD  Cardiologist:  Shirlee More, MD    Referring MD: Algis Greenhouse, MD    ASSESSMENT:    1. Essential hypertension   2. Hyperlipidemia, unspecified hyperlipidemia type    PLAN:    In order of problems listed above:  1. Blood pressures well controlled continue his current regimen including his ARB and calcium channel blocker.  He will have a CMP performed to look at his potassium and renal function.  He has a prescription to use clonidine as needed not recently 2. Continue with statin recheck lipid profile liver function   Next appointment: 1 year   Medication Adjustments/Labs and Tests Ordered: Current medicines are reviewed at length with the patient today.  Concerns regarding medicines are outlined above.  No orders of the defined types were placed in this encounter.  No orders of the defined types were placed in this encounter.   Chief Complaint  Patient presents with  . Follow-up  . Hypertension  . Hyperlipidemia    History of Present Illness:   Dyslipidemia, HTN, and coronary artery calcification on CT    Brad Hamilton is a 73 y.o. male with a hx of Dyslipidemia with intolerance of high intensity statin, HTN, and coronary artery calcification on CT  last seen 08/14/17. Compliance with diet, lifestyle and medications: yes  Carotid duplex 09/25/17:   Final Interpretation: Right Carotid: There is evidence in the right ICA of a 1-39% stenosis. Minimal intimal thickening noted . Left Carotid: There is evidence in the left ICA of a 1-39% stenosis. Minimal intimal thickening noted. Vertebrals: Both vertebral arteries were patent with antegrade flow.  Unfortunately sick today with acute sinusitis outside of this he feels quite well exercises no chest pain shortness of breath palpitation or syncope and his carotid duplex showed minimal  atherosclerosis.  He will continue his current antihypertensive lipid-lowering therapy I do not see an indication for any vascular testing or ischemia evaluation I will plan to see in the office in 1 year or sooner.  He is feeling so poorly today that he will return to the office next week we will check a CMP and lipid profile  Past Medical History:  Diagnosis Date  . Acute laryngotracheitis 03/20/2017   Overview:  2018  . 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 Arthralgia of right temporomandibular joint 06/28/2017   Overview:  2018  . 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 Benign hypertension 03/05/2016  . Cancer ([place]) 2005   skin cancer on abdomen  . Complication of anesthesia    hyper coming out of anesthesia 1987 cystoscopy  . Coronary artery calcification seen on CT scan 04/17/2016  . Cough    occasional cough with allergies  . Essential hypertension 04/17/2016  . Foot drop, right   . Gastritis 07/18/2017  . GERD (gastroesophageal reflux disease)   . Hearing loss 07/18/2017  . Hyperlipidemia 07/18/2017  . Kidney stones 4 yrs ago   passed on own  . Screening for diabetes mellitus (DM) 04/19/2016  . Skin cancer 07/18/2017  . Sleep apnea 07/18/2017    Past Surgical History:  Procedure Laterality Date  . APPENDECTOMY  1958  . CARDIAC CATHETERIZATION  1998  . CERVICAL LAMINECTOMY  1977  . CYSTOSCOPY  1987  . LAMINECTOMY  2011  . LUMBAR LAMINECTOMY/DECOMPRESSION MICRODISCECTOMY  04/03/2012   Procedure: LUMBAR LAMINECTOMY/DECOMPRESSION MICRODISCECTOMY 1  LEVEL;  Surgeon: Magnus Sinning, MD;  Location: [facility];  Service: Orthopedics;  Laterality: Right;  Microdiscectomy L4 - L5 on the Right (X-Ray)   . sinus surgery x 2 1980's      Current Medications: Current Meds  Medication Sig  . amoxicillin-clavulanate (AUGMENTIN) 875-125 MG tablet Take 1 tablet by mouth 2 (two) times daily.  . ANDROGEL PUMP 20.25 MG/ACT (1.62%) GEL Apply 1 application topically 2 (two) times daily.  Marland Kitchen aspirin EC 81 MG tablet Take 81  mg by mouth daily with breakfast.  . buPROPion (WELLBUTRIN XL) 150 MG 24 hr tablet Take 150 mg by mouth daily with breakfast.  . calcium carbonate-magnesium hydroxide (ROLAIDS) 334 MG CHEW Chew 1 tablet by mouth as needed.  Marland Kitchen CIALIS 20 MG tablet Take 20 mg by mouth daily as needed.  . cloNIDine (CATAPRES) 0.1 MG tablet Take 0.1 mg by mouth daily as needed.  . diltiazem (CARDIZEM CD) 120 MG 24 hr capsule TAKE ONE CAPSULE BY MOUTH TWICE A DAY  . ibuprofen (ADVIL,MOTRIN) 200 MG tablet Take 400-600 mg by mouth every 6 (six) hours as needed. For pain  . olmesartan (BENICAR) 20 MG tablet TAKE 1 TABLET ONCE DAILY (DO NOT TAKE IF BLOOD PRESSURE IS LESS THAN 120)  . OVER THE COUNTER MEDICATION Take by mouth daily with breakfast. Alphaloproic Acid=For immune system  . Pitavastatin Calcium (LIVALO) 1 MG TABS TAKE 1 TABLET BY MOUTH 2 DAYS A WEEK AS DIRECTED (Patient taking differently: TAKE 1 TABLET BY MOUTH 1 DAYS A WEEK AS DIRECTED)  . Saw Palmetto 500 MG CAPS Take 500 mg by mouth daily with breakfast.  . vitamin C (ASCORBIC ACID) 500 MG tablet Take 500 mg by mouth daily with breakfast.  . zinc gluconate 50 MG tablet Take 50 mg by mouth daily.     Allergies:   Statins   Social History   Socioeconomic History  . Marital status: Single    Spouse name: Not on file  . Number of children: Not on file  . Years of education: Not on file  . Highest education level: Not on file  Occupational History  . Not on file  Social Needs  . Financial resource strain: Not on file  . Food insecurity:    Worry: Not on file    Inability: Not on file  . Transportation needs:    Medical: Not on file    Non-medical: Not on file  Tobacco Use  . Smoking status: Former Smoker    Packs/day: 2.00    Years: 30.00    Pack years: 60.00    Last attempt to quit: 07/12/2000    Years since quitting: 18.3  . Smokeless tobacco: Never Used  Substance and Sexual Activity  . Alcohol use: Yes    Alcohol/week: 2.0 standard  drinks    Types: 1 Glasses of wine, 1 Cans of beer per week    Comment: 1 beer or wine per day  . Drug use: Not on file  . Sexual activity: Not on file  Lifestyle  . Physical activity:    Days per week: Not on file    Minutes per session: Not on file  . Stress: Not on file  Relationships  . Social connections:    Talks on phone: Not on file    Gets together: Not on file    Attends religious service: Not on file    Active member of club or organization: Not on file    Attends meetings  of clubs or organizations: Not on file    Relationship status: Not on file  Other Topics Concern  . Not on file  Social History Narrative  . Not on file     Family History: The patient's family history includes Bleeding Disorder in his mother; Cancer in his maternal grandmother and mother; Diabetes in his father and mother; Heart failure in his father; Hypertension in his father, mother, and sister; Prostate cancer in his father; Stroke in his maternal grandfather. ROS:   Please see the history of present illness.    All other systems reviewed and are negative.  EKGs/Labs/Other Studies Reviewed:    The following studies were reviewed today:  EKG:  EKG ordered today.  The ekg ordered today demonstrates SRTh and normal  Recent Labs: No results found for requested labs within last 8760 hours.  Recent Lipid Panel    Component Value Date/Time   CHOL 190 08/14/2017 1238   TRIG 115 08/14/2017 1238   HDL 54 08/14/2017 1238   CHOLHDL 3.5 08/14/2017 1238   LDLCALC 113 (H) 08/14/2017 1238    Physical Exam:    VS:  BP 134/78 (BP Location: Left Arm, Patient Position: Sitting, Cuff Size: Normal)   Ht 5\' 6"  (1.676 m)   Wt 166 lb 12.8 oz (75.7 kg)   SpO2 96%   BMI 26.92 kg/m     Wt Readings from Last 3 Encounters:  11/28/18 166 lb 12.8 oz (75.7 kg)  08/14/17 167 lb 0.6 oz (75.8 kg)  04/03/12 166 lb 14.4 oz (75.7 kg)     GEN:  Well nourished, well developed in no acute distress HEENT:  Normal NECK: No JVD; No carotid bruits LYMPHATICS: No lymphadenopathy CARDIAC: RRR, no murmurs, rubs, gallops RESPIRATORY:  Clear to auscultation without rales, wheezing or rhonchi  ABDOMEN: Soft, non-tender, non-distended MUSCULOSKELETAL:  No edema; No deformity  SKIN: Warm and dry NEUROLOGIC:  Alert and oriented x 3 PSYCHIATRIC:  Normal affect    Signed, Shirlee More, MD  11/28/2018 3:29 PM    [facility]

## 2018-11-28 NOTE — Patient Instructions (Signed)
Medication Instructions:  Your physician recommends that you continue on your current medications as directed. Please refer to the Current Medication list given to you today.  If you need a refill on your cardiac medications before your next appointment, please call your pharmacy.   Lab work: Your physician recommends that you return for lab work in 1 week: CMP, lipid panel. Please return to our office, no appointment needed. Please fast beforehand.   If you have labs (blood work) drawn today and your tests are completely normal, you will receive your results only by: Marland Kitchen MyChart Message (if you have MyChart) OR . A paper copy in the mail If you have any lab test that is abnormal or we need to change your treatment, we will call you to review the results.  Testing/Procedures: You had an EKG today.   Follow-Up: At [facility], you and your health needs are our priority.  As part of our continuing mission to provide you with exceptional heart care, we have created designated Provider Care Teams.  These Care Teams include your primary Cardiologist (physician) and Advanced Practice Providers (APPs -  Physician Assistants and Nurse Practitioners) who all work together to provide you with the care you need, when you need it. You will need a follow up appointment in 1 years.  Please call our office 2 months in advance to schedule this appointment.

## 2018-12-20 ENCOUNTER — Other Ambulatory Visit: Payer: Self-pay | Admitting: Cardiology

## 2019-01-03 DIAGNOSIS — R55 Syncope and collapse: Secondary | ICD-10-CM

## 2019-01-03 DIAGNOSIS — R001 Bradycardia, unspecified: Secondary | ICD-10-CM

## 2019-01-03 DIAGNOSIS — N179 Acute kidney failure, unspecified: Secondary | ICD-10-CM

## 2019-01-03 DIAGNOSIS — R112 Nausea with vomiting, unspecified: Secondary | ICD-10-CM

## 2019-01-03 DIAGNOSIS — R7989 Other specified abnormal findings of blood chemistry: Secondary | ICD-10-CM

## 2019-01-03 DIAGNOSIS — I959 Hypotension, unspecified: Secondary | ICD-10-CM | POA: Diagnosis not present

## 2019-01-21 ENCOUNTER — Telehealth: Payer: Self-pay | Admitting: *Deleted

## 2019-01-21 ENCOUNTER — Other Ambulatory Visit: Payer: Self-pay | Admitting: Cardiology

## 2019-01-21 MED ORDER — PITAVASTATIN CALCIUM 1 MG PO TABS: ORAL_TABLET | ORAL | 0 refills | Status: DC

## 2019-01-21 NOTE — Telephone Encounter (Signed)
Jessica from [place] called to clarify instructions for livalo as patient reports that he takes livalo 1 mg twice weekly and it was refilled for once weekly. After reviewing the patient's chart, verbal order was given for livalo 1 mg to be refilled for twice weekly. Janett Billow verbalized understanding. Correction made to medication list. No further questions.

## 2019-03-23 ENCOUNTER — Telehealth: Payer: Self-pay | Admitting: Cardiology

## 2019-03-23 NOTE — Telephone Encounter (Signed)
Phoned patient who states that another physician has recommended another provider to treat varicosities.No further questions or concerns.

## 2019-03-23 NOTE — Telephone Encounter (Signed)
Patient would like a referral to a varicose vein specialist.  Please call patient to discuss [phone]

## 2019-03-27 ENCOUNTER — Other Ambulatory Visit: Payer: Self-pay | Admitting: Cardiology

## 2019-03-27 ENCOUNTER — Other Ambulatory Visit: Payer: Self-pay | Admitting: *Deleted

## 2019-03-27 MED ORDER — PITAVASTATIN CALCIUM 1 MG PO TABS: ORAL_TABLET | ORAL | 3 refills | Status: DC

## 2019-03-27 NOTE — Telephone Encounter (Signed)
Refill sent.

## 2019-04-01 DIAGNOSIS — I83893 Varicose veins of bilateral lower extremities with other complications: Secondary | ICD-10-CM

## 2019-04-01 DIAGNOSIS — I872 Venous insufficiency (chronic) (peripheral): Secondary | ICD-10-CM | POA: Insufficient documentation

## 2019-04-01 HISTORY — DX: Varicose veins of bilateral lower extremities with other complications: I83.893

## 2019-04-01 HISTORY — DX: Venous insufficiency (chronic) (peripheral): I87.2

## 2019-04-03 DIAGNOSIS — S4992XA Unspecified injury of left shoulder and upper arm, initial encounter: Secondary | ICD-10-CM | POA: Insufficient documentation

## 2019-04-03 HISTORY — DX: Unspecified injury of left shoulder and upper arm, initial encounter: S49.92XA

## 2019-05-26 ENCOUNTER — Other Ambulatory Visit: Payer: Self-pay | Admitting: Cardiology

## 2019-05-28 NOTE — Telephone Encounter (Signed)
Livalo refill sent to [facility]

## 2019-06-25 HISTORY — PX: COLONOSCOPY: SHX174

## 2019-07-09 DIAGNOSIS — R5383 Other fatigue: Secondary | ICD-10-CM | POA: Insufficient documentation

## 2019-07-09 HISTORY — DX: Other fatigue: R53.83

## 2019-07-13 ENCOUNTER — Other Ambulatory Visit: Payer: Self-pay | Admitting: Cardiology

## 2019-10-14 ENCOUNTER — Telehealth: Payer: Self-pay | Admitting: Cardiology

## 2019-10-14 MED ORDER — LIVALO 1 MG PO TABS: ORAL_TABLET | ORAL | 0 refills | Status: DC

## 2019-10-14 NOTE — Telephone Encounter (Signed)
Livalo sent to [facility] as requested.

## 2019-10-14 NOTE — Telephone Encounter (Signed)
Call livalo to [place] [facility]

## 2019-10-14 NOTE — Addendum Note (Signed)
Addended by: Stevan Born on: 10/14/2019 05:05 PM   Modules accepted: Orders

## 2019-10-16 DIAGNOSIS — I8393 Asymptomatic varicose veins of bilateral lower extremities: Secondary | ICD-10-CM | POA: Insufficient documentation

## 2019-10-16 HISTORY — DX: Asymptomatic varicose veins of bilateral lower extremities: I83.93

## 2019-11-26 DIAGNOSIS — B353 Tinea pedis: Secondary | ICD-10-CM

## 2019-11-26 DIAGNOSIS — L988 Other specified disorders of the skin and subcutaneous tissue: Secondary | ICD-10-CM

## 2019-11-26 DIAGNOSIS — B351 Tinea unguium: Secondary | ICD-10-CM

## 2019-11-26 HISTORY — DX: Other specified disorders of the skin and subcutaneous tissue: L98.8

## 2019-11-26 HISTORY — DX: Tinea pedis: B35.3

## 2019-11-26 HISTORY — DX: Tinea unguium: B35.1

## 2019-12-14 NOTE — Progress Notes (Deleted)
Cardiology Office Note:    Date:  12/14/2019   ID:  Brad Hamilton, DOB November 20, 1945, MRN [id number]  PCP:  Algis Greenhouse, MD  Cardiologist:  Shirlee More, MD    Referring MD: Algis Greenhouse, MD    ASSESSMENT:    No diagnosis found. PLAN:    In order of problems listed above:  1. ***   Next appointment: ***   Medication Adjustments/Labs and Tests Ordered: Current medicines are reviewed at length with the patient today.  Concerns regarding medicines are outlined above.  No orders of the defined types were placed in this encounter.  No orders of the defined types were placed in this encounter.   No chief complaint on file.   History of Present Illness:    Brad Hamilton is a 74 y.o. male with a hx of Dyslipidemia with intolerance of high intensity statin, HTN, and coronary artery calcification on CT  last seen 11/18/2018.  He has very mild carotid atherosclerosis on duplex 2018 Compliance with diet, lifestyle and medications: *** Past Medical History:  Diagnosis Date  . Acute laryngotracheitis 03/20/2017   Overview:  2018  . 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 Arthralgia of right temporomandibular joint 06/28/2017   Overview:  2018  . 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 Benign hypertension 03/05/2016  . Cancer ([place]) 2005   skin cancer on abdomen  . Complication of anesthesia    hyper coming out of anesthesia 1987 cystoscopy  . Coronary artery calcification seen on CT scan 04/17/2016  . Cough    occasional cough with allergies  . Essential hypertension 04/17/2016  . Foot drop, right   . Gastritis 07/18/2017  . GERD (gastroesophageal reflux disease)   . Hearing loss 07/18/2017  . Hyperlipidemia 07/18/2017  . Kidney stones 4 yrs ago   passed on own  . Screening for diabetes mellitus (DM) 04/19/2016  . Skin cancer 07/18/2017  . Sleep apnea 07/18/2017    Past Surgical History:  Procedure Laterality Date  . APPENDECTOMY  1958  . CARDIAC CATHETERIZATION  1998  . CERVICAL  LAMINECTOMY  1977  . CYSTOSCOPY  1987  . LAMINECTOMY  2011  . LUMBAR LAMINECTOMY/DECOMPRESSION MICRODISCECTOMY  04/03/2012   Procedure: LUMBAR LAMINECTOMY/DECOMPRESSION MICRODISCECTOMY 1 LEVEL;  Surgeon: Magnus Sinning, MD;  Location: [facility];  Service: Orthopedics;  Laterality: Right;  Microdiscectomy L4 - L5 on the Right (X-Ray)   . sinus surgery x 2 1980's      Current Medications: No outpatient medications have been marked as taking for the 12/15/19 encounter (Appointment) with Richardo Priest, MD.     Allergies:   Statins   Social History   Socioeconomic History  . Marital status: Single    Spouse name: Not on file  . Number of children: Not on file  . Years of education: Not on file  . Highest education level: Not on file  Occupational History  . Not on file  Tobacco Use  . Smoking status: Former Smoker    Packs/day: 2.00    Years: 30.00    Pack years: 60.00    Quit date: 07/12/2000    Years since quitting: 19.4  . Smokeless tobacco: Never Used  Substance and Sexual Activity  . Alcohol use: Yes    Alcohol/week: 2.0 standard drinks    Types: 1 Glasses of wine, 1 Cans of beer per week    Comment: 1 beer or wine per day  . Drug use: Not on file  . Sexual  activity: Not on file  Other Topics Concern  . Not on file  Social History Narrative  . Not on file   Social Determinants of Health   Financial Resource Strain:   . Difficulty of Paying Living Expenses: Not on file  Food Insecurity:   . Worried About Charity fundraiser in the Last Year: Not on file  . Ran Out of Food in the Last Year: Not on file  Transportation Needs:   . Lack of Transportation (Medical): Not on file  . Lack of Transportation (Non-Medical): Not on file  Physical Activity:   . Days of Exercise per Week: Not on file  . Minutes of Exercise per Session: Not on file  Stress:   . Feeling of Stress : Not on file  Social Connections:   . Frequency of Communication with Friends and Family: Not  on file  . Frequency of Social Gatherings with Friends and Family: Not on file  . Attends Religious Services: Not on file  . Active Member of Clubs or Organizations: Not on file  . Attends Archivist Meetings: Not on file  . Marital Status: Not on file     Family History: The patient's ***family history includes Bleeding Disorder in his mother; Cancer in his maternal grandmother and mother; Diabetes in his father and mother; Heart failure in his father; Hypertension in his father, mother, and sister; Prostate cancer in his father; Stroke in his maternal grandfather. ROS:   Please see the history of present illness.    All other systems reviewed and are negative.  EKGs/Labs/Other Studies Reviewed:    The following studies were reviewed today:  EKG:  EKG ordered today and personally reviewed.  The ekg ordered today demonstrates ***  Recent Labs: No results found for requested labs within last 8760 hours.  Recent Lipid Panel    Component Value Date/Time   CHOL 190 08/14/2017 1238   TRIG 115 08/14/2017 1238   HDL 54 08/14/2017 1238   CHOLHDL 3.5 08/14/2017 1238   LDLCALC 113 (H) 08/14/2017 1238    Physical Exam:    VS:  There were no vitals taken for this visit.    Wt Readings from Last 3 Encounters:  11/28/18 166 lb 12.8 oz (75.7 kg)  08/14/17 167 lb 0.6 oz (75.8 kg)  04/03/12 166 lb 14.4 oz (75.7 kg)     GEN: *** Well nourished, well developed in no acute distress HEENT: Normal NECK: No JVD; No carotid bruits LYMPHATICS: No lymphadenopathy CARDIAC: ***RRR, no murmurs, rubs, gallops RESPIRATORY:  Clear to auscultation without rales, wheezing or rhonchi  ABDOMEN: Soft, non-tender, non-distended MUSCULOSKELETAL:  No edema; No deformity  SKIN: Warm and dry NEUROLOGIC:  Alert and oriented x 3 PSYCHIATRIC:  Normal affect    Signed, Shirlee More, MD  12/14/2019 2:17 PM    [facility]

## 2019-12-15 ENCOUNTER — Ambulatory Visit: Payer: 59 | Admitting: Cardiology

## 2019-12-28 DIAGNOSIS — M79604 Pain in right leg: Secondary | ICD-10-CM

## 2019-12-28 DIAGNOSIS — M545 Low back pain, unspecified: Secondary | ICD-10-CM | POA: Insufficient documentation

## 2019-12-28 HISTORY — DX: Low back pain, unspecified: M54.50

## 2019-12-28 HISTORY — DX: Pain in right leg: M79.604

## 2020-01-02 NOTE — Progress Notes (Signed)
Cardiology Office Note:    Date:  01/04/2020   ID:  Brad Hamilton, DOB November 21, 1945, MRN [id number]  PCP:  Algis Greenhouse, MD  Cardiologist:  Shirlee More, MD    Referring MD: Algis Greenhouse, MD    ASSESSMENT:    1. Essential hypertension   2. Mixed hyperlipidemia    PLAN:    In order of problems listed above:  1. BP at target continue current treatment he has a prescription for clonidine for paroxysms of hypertension has not needed since the summer 2. He has chronic statin use poorly tolerated low-dose Zetia recheck 6 to 8 weeks goal LDL less than 70   Next appointment: 6 months   Medication Adjustments/Labs and Tests Ordered: Current medicines are reviewed at length with the patient today.  Concerns regarding medicines are outlined above.  Orders Placed This Encounter  Procedures  . Lipid panel  . EKG 12-Lead   Meds ordered this encounter  Medications  . Pitavastatin Calcium (LIVALO) 1 MG TABS    Sig: TAKE 1 TABLET BY MOUTH TWICE A WEEK AS DIRECTED    Dispense:  24 tablet    Refill:  0  . ezetimibe (ZETIA) 10 MG tablet    Sig: Take 0.5 tablets (5 mg total) by mouth daily.    Dispense:  45 tablet    Refill:  3    No chief complaint on file.   History of Present Illness:    Brad Hamilton is a 74 y.o. male with a hx of Dyslipidemia with intolerance of high intensity statin, HTN, and coronary artery calcification on CT  last seen 11/18/2018.  He has very mild carotid atherosclerosis on duplex 2018.  He has had sclerotherapy for lower extremity venous varicosities. Compliance with diet, lifestyle and medications: Yes  He was having a post exertional lightheadedness his dose of ARB was decreased he has had no recurrence.  Home blood pressure consistently less than 140/80-85.  He is poorly statin intolerant his LDL remains above 70 and he will add low-dose Zetia recheck his lipids in 6 weeks.  No angina shortness of breath palpitation or syncope Past Medical  History:  Diagnosis Date  . Acute laryngotracheitis 03/20/2017   Overview:  2018  . 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 Arthralgia of right temporomandibular joint 06/28/2017   Overview:  2018  . 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 Benign hypertension 03/05/2016  . Cancer ([place]) 2005   skin cancer on abdomen  . Complication of anesthesia    hyper coming out of anesthesia 1987 cystoscopy  . Coronary artery calcification seen on CT scan 04/17/2016  . Cough    occasional cough with allergies  . Essential hypertension 04/17/2016  . Foot drop, right   . Gastritis 07/18/2017  . GERD (gastroesophageal reflux disease)   . Hearing loss 07/18/2017  . Hyperlipidemia 07/18/2017  . Kidney stones 4 yrs ago   passed on own  . Screening for diabetes mellitus (DM) 04/19/2016  . Skin cancer 07/18/2017  . Sleep apnea 07/18/2017    Past Surgical History:  Procedure Laterality Date  . APPENDECTOMY  1958  . CARDIAC CATHETERIZATION  1998  . CERVICAL LAMINECTOMY  1977  . CYSTOSCOPY  1987  . LAMINECTOMY  2011  . LUMBAR LAMINECTOMY/DECOMPRESSION MICRODISCECTOMY  04/03/2012   Procedure: LUMBAR LAMINECTOMY/DECOMPRESSION MICRODISCECTOMY 1 LEVEL;  Surgeon: Magnus Sinning, MD;  Location: [facility];  Service: Orthopedics;  Laterality: Right;  Microdiscectomy L4 - L5 on the Right (X-Ray)   .  sinus surgery x 2 1980's      Current Medications: Current Meds  Medication Sig  . ANDROGEL PUMP 20.25 MG/ACT (1.62%) GEL Apply 1 application topically 2 (two) times daily.  Marland Kitchen aspirin EC 81 MG tablet Take 81 mg by mouth daily with breakfast.  . buPROPion (WELLBUTRIN XL) 150 MG 24 hr tablet Take 150 mg by mouth daily with breakfast.  . calcium carbonate-magnesium hydroxide (ROLAIDS) 334 MG CHEW Chew 1 tablet by mouth as needed.  . candesartan (ATACAND) 32 MG tablet candesartan 32 mg tablet  . CIALIS 20 MG tablet Take 20 mg by mouth daily as needed.  . cloNIDine (CATAPRES) 0.1 MG tablet Take 0.1 mg by mouth daily as needed.  . diltiazem  (CARDIZEM CD) 120 MG 24 hr capsule TAKE ONE CAPSULE BY MOUTH TWICE A DAY  . ibuprofen (ADVIL,MOTRIN) 200 MG tablet Take 400-600 mg by mouth every 6 (six) hours as needed. For pain  . olmesartan (BENICAR) 20 MG tablet TAKE ONE (1) TABLET BY MOUTH ONCE DAILY (DO NOT TAKE IF BLOOD PRESSURE IS LESS THAN 120)  . OVER THE COUNTER MEDICATION Take by mouth daily with breakfast. Alphaloproic Acid=For immune system  . Pitavastatin Calcium (LIVALO) 1 MG TABS TAKE 1 TABLET BY MOUTH TWICE A WEEK AS DIRECTED  . Saw Palmetto 500 MG CAPS Take 500 mg by mouth daily with breakfast.  . terbinafine (LAMISIL) 250 MG tablet Take by mouth.  . vitamin C (ASCORBIC ACID) 500 MG tablet Take 500 mg by mouth daily with breakfast.  . zinc gluconate 50 MG tablet Take 50 mg by mouth daily.  . [DISCONTINUED] Pitavastatin Calcium (LIVALO) 1 MG TABS TAKE 1 TABLET BY MOUTH TWICE A WEEK AS DIRECTED     Allergies:   Statins   Social History   Socioeconomic History  . Marital status: Single    Spouse name: Not on file  . Number of children: Not on file  . Years of education: Not on file  . Highest education level: Not on file  Occupational History  . Not on file  Tobacco Use  . Smoking status: Former Smoker    Packs/day: 2.00    Years: 30.00    Pack years: 60.00    Quit date: 07/12/2000    Years since quitting: 19.4  . Smokeless tobacco: Never Used  Substance and Sexual Activity  . Alcohol use: Yes    Alcohol/week: 2.0 standard drinks    Types: 1 Glasses of wine, 1 Cans of beer per week    Comment: 1 beer or wine per day  . Drug use: Never  . Sexual activity: Yes  Other Topics Concern  . Not on file  Social History Narrative  . Not on file   Social Determinants of Health   Financial Resource Strain:   . Difficulty of Paying Living Expenses: Not on file  Food Insecurity:   . Worried About Charity fundraiser in the Last Year: Not on file  . Ran Out of Food in the Last Year: Not on file  Transportation  Needs:   . Lack of Transportation (Medical): Not on file  . Lack of Transportation (Non-Medical): Not on file  Physical Activity:   . Days of Exercise per Week: Not on file  . Minutes of Exercise per Session: Not on file  Stress:   . Feeling of Stress : Not on file  Social Connections:   . Frequency of Communication with Friends and Family: Not on file  . Frequency  of Social Gatherings with Friends and Family: Not on file  . Attends Religious Services: Not on file  . Active Member of Clubs or Organizations: Not on file  . Attends Archivist Meetings: Not on file  . Marital Status: Not on file     Family History: The patient's family history includes Bleeding Disorder in his mother; Cancer in his maternal grandmother and mother; Diabetes in his father and mother; Heart failure in his father; Hypertension in his father, mother, and sister; Prostate cancer in his father; Stroke in his maternal grandfather. ROS:   Please see the history of present illness.    All other systems reviewed and are negative.  EKGs/Labs/Other Studies Reviewed:    The following studies were reviewed today:  EKG:  EKG ordered today and personally reviewed.  The ekg ordered today demonstrates sinus rhythm left axis deviation  Recent Labs: From [facility] 12/11/2019: Cholesterol 172 LDL 89 HDL 43 triglyceride mildly elevated 176. CMP potassium 5.0 GFR 58 cc creatinine 1.22 normal liver function test CBC with mild anemia hemoglobin 12.3 and macrocytic Recent Lipid Panel    Component Value Date/Time   CHOL 190 08/14/2017 1238   TRIG 115 08/14/2017 1238   HDL 54 08/14/2017 1238   CHOLHDL 3.5 08/14/2017 1238   LDLCALC 113 (H) 08/14/2017 1238    Physical Exam:    VS:  BP (!) 152/80   Pulse 70   Temp 97.7 F (36.5 C)   Ht 5\' 6"  (1.676 m)   Wt 170 lb 3.2 oz (77.2 kg)   SpO2 94%   BMI 27.47 kg/m     Wt Readings from Last 3 Encounters:  01/04/20 170 lb 3.2 oz (77.2 kg)  11/28/18  166 lb 12.8 oz (75.7 kg)  08/14/17 167 lb 0.6 oz (75.8 kg)     GEN:  Well nourished, well developed in no acute distress HEENT: Normal NECK: No JVD; No carotid bruits LYMPHATICS: No lymphadenopathy CARDIAC: RRR, no murmurs, rubs, gallops RESPIRATORY:  Clear to auscultation without rales, wheezing or rhonchi  ABDOMEN: Soft, non-tender, non-distended MUSCULOSKELETAL:  No edema; No deformity  SKIN: Warm and dry NEUROLOGIC:  Alert and oriented x 3 PSYCHIATRIC:  Normal affect    Signed, Shirlee More, MD  01/04/2020 4:19 PM    [facility]

## 2020-01-04 ENCOUNTER — Encounter: Payer: Self-pay | Admitting: Cardiology

## 2020-01-04 ENCOUNTER — Ambulatory Visit (INDEPENDENT_AMBULATORY_CARE_PROVIDER_SITE_OTHER): Payer: 59 | Admitting: Cardiology

## 2020-01-04 ENCOUNTER — Other Ambulatory Visit: Payer: Self-pay

## 2020-01-04 VITALS — BP 152/80 | HR 70 | Temp 97.7°F | Ht 66.0 in | Wt 170.2 lb

## 2020-01-04 DIAGNOSIS — E782 Mixed hyperlipidemia: Secondary | ICD-10-CM

## 2020-01-04 DIAGNOSIS — I10 Essential (primary) hypertension: Secondary | ICD-10-CM | POA: Diagnosis not present

## 2020-01-04 MED ORDER — LIVALO 1 MG PO TABS: ORAL_TABLET | ORAL | 0 refills | Status: DC

## 2020-01-04 MED ORDER — EZETIMIBE 10 MG PO TABS: 5.0000 mg | ORAL_TABLET | Freq: Every day | ORAL | 3 refills | Status: DC

## 2020-01-04 NOTE — Patient Instructions (Signed)
Medication Instructions:  Your physician has recommended you make the following change in your medication:  1.  START Zetia 10 mg taking only 1/2 tablet daily  *If you need a refill on your cardiac medications before your next appointment, please call your pharmacy*  Lab Work: 02/16/2020:  COME TO THE OFFICE FOR FASTING LIPID.  NOTHING TO EAT OR DRINK AFTER MIDNIGHT THE NIGHT BEFORE YOU CAN COME AS EARLY AS 8:00 A.M  If you have labs (blood work) drawn today and your tests are completely normal, you will receive your results only by: Marland Kitchen MyChart Message (if you have MyChart) OR . A paper copy in the mail If you have any lab test that is abnormal or we need to change your treatment, we will call you to review the results.  Testing/Procedures: None ordered   Follow-Up: At [facility], you and your health needs are our priority.  As part of our continuing mission to provide you with exceptional heart care, we have created designated Provider Care Teams.  These Care Teams include your primary Cardiologist (physician) and Advanced Practice Providers (APPs -  Physician Assistants and Nurse Practitioners) who all work together to provide you with the care you need, when you need it.  Your next appointment:   6 month(s)  The format for your next appointment:   In Person  Provider:   Shirlee More, MD  Other Instructions

## 2020-01-21 DIAGNOSIS — D649 Anemia, unspecified: Secondary | ICD-10-CM

## 2020-01-30 ENCOUNTER — Other Ambulatory Visit: Payer: Self-pay | Admitting: Cardiology

## 2020-01-30 ENCOUNTER — Encounter: Payer: Self-pay | Admitting: Cardiology

## 2020-02-04 DIAGNOSIS — D649 Anemia, unspecified: Secondary | ICD-10-CM

## 2020-02-08 ENCOUNTER — Ambulatory Visit: Payer: 59 | Admitting: Cardiology

## 2020-03-24 ENCOUNTER — Other Ambulatory Visit: Payer: Self-pay | Admitting: Cardiology

## 2020-04-15 DIAGNOSIS — M546 Pain in thoracic spine: Secondary | ICD-10-CM | POA: Insufficient documentation

## 2020-04-15 HISTORY — DX: Pain in thoracic spine: M54.6

## 2020-04-19 DIAGNOSIS — M47816 Spondylosis without myelopathy or radiculopathy, lumbar region: Secondary | ICD-10-CM

## 2020-04-19 HISTORY — DX: Spondylosis without myelopathy or radiculopathy, lumbar region: M47.816

## 2020-06-19 DIAGNOSIS — K5730 Diverticulosis of large intestine without perforation or abscess without bleeding: Secondary | ICD-10-CM | POA: Insufficient documentation

## 2020-06-19 HISTORY — DX: Diverticulosis of large intestine without perforation or abscess without bleeding: K57.30

## 2020-07-12 DIAGNOSIS — J340 Abscess, furuncle and carbuncle of nose: Secondary | ICD-10-CM | POA: Insufficient documentation

## 2020-07-12 HISTORY — DX: Abscess, furuncle and carbuncle of nose: J34.0

## 2020-07-29 ENCOUNTER — Other Ambulatory Visit: Payer: Self-pay | Admitting: Cardiology

## 2020-10-11 ENCOUNTER — Other Ambulatory Visit: Payer: Self-pay | Admitting: Cardiology

## 2020-10-11 NOTE — Telephone Encounter (Signed)
Rx refill sent to pharmacy. 

## 2020-11-10 ENCOUNTER — Other Ambulatory Visit: Payer: Self-pay | Admitting: Cardiology

## 2020-11-23 DIAGNOSIS — T8859XA Other complications of anesthesia, initial encounter: Secondary | ICD-10-CM | POA: Insufficient documentation

## 2020-11-23 DIAGNOSIS — N200 Calculus of kidney: Secondary | ICD-10-CM | POA: Insufficient documentation

## 2020-11-23 DIAGNOSIS — R059 Cough, unspecified: Secondary | ICD-10-CM | POA: Insufficient documentation

## 2020-11-23 DIAGNOSIS — M21371 Foot drop, right foot: Secondary | ICD-10-CM | POA: Insufficient documentation

## 2020-11-23 DIAGNOSIS — K219 Gastro-esophageal reflux disease without esophagitis: Secondary | ICD-10-CM | POA: Insufficient documentation

## 2020-11-29 DIAGNOSIS — U071 COVID-19: Secondary | ICD-10-CM

## 2020-11-29 HISTORY — DX: COVID-19: U07.1

## 2020-12-02 ENCOUNTER — Ambulatory Visit: Payer: 59 | Admitting: Cardiology

## 2021-01-01 NOTE — Progress Notes (Signed)
Cardiology Office Note:    Date:  01/02/2021   ID:  Brad Hamilton, Brad Hamilton 1946-05-30, MRN [id number]  PCP:  Algis Greenhouse, MD  Cardiologist:  Shirlee More, MD    Referring MD: Algis Greenhouse, MD    ASSESSMENT:    1. Essential hypertension   2. Mixed hyperlipidemia   3. Coronary artery calcification seen on CT scan    PLAN:    In order of problems listed above:  1. He continues to do well blood pressure is well controlled and continue his current medical regimen including rate limiting calcium channel blocker and ARB.  He has a prescription for clonidine if needed for a paroxysm of hypertension 2. Stable continue his current lipid-lowering therapy we will check CMP for liver function renal function potassium as well as lipid profile. 3. Continue aspirin with coronary artery calcification as well as lipid-lowering   Next appointment: 6 months   Medication Adjustments/Labs and Tests Ordered: Current medicines are reviewed at length with the patient today.  Concerns regarding medicines are outlined above.  Orders Placed This Encounter  Procedures  . Comprehensive metabolic panel  . Lipid panel  . EKG 12-Lead   No orders of the defined types were placed in this encounter.   Chief Complaint  Patient presents with  . Follow-up  . Hypertension  . Hyperlipidemia    History of Present Illness:    Brad Hamilton is a 75 y.o. male with a hx of hypertension and hyperlipidemia with intolerance of high test sensitivity statins last seen 01/04/2020. He has very mild carotid atherosclerosis on duplex 2018.  He has had sclerotherapy for lower extremity venous varicosities.   Compliance with diet, lifestyle and medications:  Yes  He continues to be active he had quite a bit of fatigue after Covid infection fully vaccinated but is recovered.  Blood pressure in range has not needed clonidine tolerates statin without muscle pain or weakness and has had no  cardiovascular symptoms of chest pain edema shortness of breath palpitation or syncope.  Epic profile 12/11/2019 cholesterol 172 LDL 89 triglycerides 176 HDL 43 Past Medical History:  Diagnosis Date  . Acute laryngotracheitis 03/20/2017   Overview:  2018  . 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 Arthralgia of right temporomandibular joint 06/28/2017   Overview:  2018  . 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 Benign hypertension 03/05/2016  . Cancer ([place]) 2005   skin cancer on abdomen  . Complication of anesthesia    hyper coming out of anesthesia 1987 cystoscopy  . Coronary artery calcification seen on CT scan 04/17/2016  . Cough    occasional cough with allergies  . Essential hypertension 04/17/2016  . Foot drop, right   . Gastritis 07/18/2017  . GERD (gastroesophageal reflux disease)   . Hearing loss 07/18/2017  . Hyperlipidemia 07/18/2017  . Kidney stones 4 yrs ago   passed on own  . Screening for diabetes mellitus (DM) 04/19/2016  . Skin cancer 07/18/2017  . Sleep apnea 07/18/2017    Past Surgical History:  Procedure Laterality Date  . APPENDECTOMY  1958  . CARDIAC CATHETERIZATION  1998  . CERVICAL LAMINECTOMY  1977  . CYSTOSCOPY  1987  . LAMINECTOMY  2011  . LUMBAR LAMINECTOMY/DECOMPRESSION MICRODISCECTOMY  04/03/2012   Procedure: LUMBAR LAMINECTOMY/DECOMPRESSION MICRODISCECTOMY 1 LEVEL;  Surgeon: Magnus Sinning, MD;  Location: [facility];  Service: Orthopedics;  Laterality: Right;  Microdiscectomy L4 - L5 on the Right (X-Ray)   . sinus surgery x 2 1980's  Current Medications: Current Meds  Medication Sig  . ANDROGEL PUMP 20.25 MG/ACT (1.62%) GEL Apply 1 application topically 2 (two) times daily.  Marland Kitchen aspirin EC 81 MG tablet Take 81 mg by mouth daily with breakfast.  . buPROPion (WELLBUTRIN XL) 150 MG 24 hr tablet Take 150 mg by mouth daily with breakfast.  . calcium carbonate-magnesium hydroxide (ROLAIDS) 334 MG CHEW Chew 1 tablet by mouth as needed.  Marland Kitchen CIALIS 20 MG tablet Take 20 mg by mouth  daily as needed.  . diltiazem (CARDIZEM CD) 120 MG 24 hr capsule TAKE 1 CAPSULE BY MOUTH TWICE DAILY.  Marland Kitchen ezetimibe (ZETIA) 10 MG tablet Take 0.5 tablets (5 mg total) by mouth daily.  Marland Kitchen ibuprofen (ADVIL,MOTRIN) 200 MG tablet Take 400-600 mg by mouth every 6 (six) hours as needed. For pain  . LIVALO 2 MG TABS TAKE 1/2 TABLET BY MOUTH TWICE A WEEK ASDIRECTED.  . Multiple Vitamins-Minerals (PRESERVISION AREDS 2+MULTI VIT) CAPS Take 1 tablet by mouth 2 (two) times daily.  Marland Kitchen olmesartan (BENICAR) 20 MG tablet TAKE 1 TABLET BY MOUTH ONCE DAILY. **DO NOT TAKE IF BP IS LESS THAN 120) (Patient taking differently: Take 0.5 tablet daily prn)  . OVER THE COUNTER MEDICATION Take by mouth daily with breakfast. Alphaloproic Acid=For immune system  . Saw Palmetto 500 MG CAPS Take 500 mg by mouth daily with breakfast.  . vitamin C (ASCORBIC ACID) 500 MG tablet Take 500 mg by mouth daily with breakfast.     Allergies:   Statins   Social History   Socioeconomic History  . Marital status: Single    Spouse name: Not on file  . Number of children: Not on file  . Years of education: Not on file  . Highest education level: Not on file  Occupational History  . Not on file  Tobacco Use  . Smoking status: Former Smoker    Packs/day: 2.00    Years: 30.00    Pack years: 60.00    Quit date: 07/12/2000    Years since quitting: 20.4  . Smokeless tobacco: Never Used  Vaping Use  . Vaping Use: Never used  Substance and Sexual Activity  . Alcohol use: Yes    Alcohol/week: 2.0 standard drinks    Types: 1 Glasses of wine, 1 Cans of beer per week    Comment: 1 beer or wine per day  . Drug use: Never  . Sexual activity: Yes  Other Topics Concern  . Not on file  Social History Narrative  . Not on file   Social Determinants of Health   Financial Resource Strain: Not on file  Food Insecurity: Not on file  Transportation Needs: Not on file  Physical Activity: Not on file  Stress: Not on file  Social  Connections: Not on file     Family History: The patient's family history includes Bleeding Disorder in his mother; Cancer in his maternal grandmother and mother; Diabetes in his father and mother; Heart failure in his father; Hypertension in his father, mother, and sister; Prostate cancer in his father; Stroke in his maternal grandfather. ROS:   Please see the history of present illness.    All other systems reviewed and are negative.  EKGs/Labs/Other Studies Reviewed:    The following studies were reviewed today:  EKG:  EKG ordered today and personally reviewed.  The ekg ordered today demonstrates sinus rhythm and is normal  Recent Labs: No results found for requested labs within last 8760 hours.  Recent Lipid Panel  Component Value Date/Time   CHOL 190 08/14/2017 1238   TRIG 115 08/14/2017 1238   HDL 54 08/14/2017 1238   CHOLHDL 3.5 08/14/2017 1238   LDLCALC 113 (H) 08/14/2017 1238    Physical Exam:    VS:  BP 138/70   Pulse 67   Ht 5\' 6"  (1.676 m)   Wt 164 lb 12.8 oz (74.8 kg)   SpO2 97%   BMI 26.60 kg/m     Wt Readings from Last 3 Encounters:  01/02/21 164 lb 12.8 oz (74.8 kg)  01/04/20 170 lb 3.2 oz (77.2 kg)  11/28/18 166 lb 12.8 oz (75.7 kg)     GEN:  Well nourished, well developed in no acute distress HEENT: Normal NECK: No JVD; No carotid bruits LYMPHATICS: No lymphadenopathy CARDIAC: RRR, no murmurs, rubs, gallops RESPIRATORY:  Clear to auscultation without rales, wheezing or rhonchi  ABDOMEN: Soft, non-tender, non-distended MUSCULOSKELETAL:  No edema; No deformity  SKIN: Warm and dry NEUROLOGIC:  Alert and oriented x 3 PSYCHIATRIC:  Normal affect    Signed, Shirlee More, MD  01/02/2021 10:54 AM    [place]

## 2021-01-02 ENCOUNTER — Encounter: Payer: Self-pay | Admitting: Cardiology

## 2021-01-02 ENCOUNTER — Other Ambulatory Visit: Payer: Self-pay

## 2021-01-02 ENCOUNTER — Ambulatory Visit (INDEPENDENT_AMBULATORY_CARE_PROVIDER_SITE_OTHER): Payer: 59 | Admitting: Cardiology

## 2021-01-02 VITALS — BP 138/70 | HR 67 | Ht 66.0 in | Wt 164.8 lb

## 2021-01-02 DIAGNOSIS — I10 Essential (primary) hypertension: Secondary | ICD-10-CM

## 2021-01-02 DIAGNOSIS — E782 Mixed hyperlipidemia: Secondary | ICD-10-CM | POA: Diagnosis not present

## 2021-01-02 DIAGNOSIS — I2510 Atherosclerotic heart disease of native coronary artery without angina pectoris: Secondary | ICD-10-CM | POA: Diagnosis not present

## 2021-01-02 LAB — COMPREHENSIVE METABOLIC PANEL
ALT: 34 IU/L (ref 0–44)
AST: 26 IU/L (ref 0–40)
Albumin/Globulin Ratio: 2.2 (ref 1.2–2.2)
Albumin: 4.6 g/dL (ref 3.7–4.7)
Alkaline Phosphatase: 126 IU/L — ABNORMAL HIGH (ref 44–121)
BUN/Creatinine Ratio: 22 (ref 10–24)
BUN: 23 mg/dL (ref 8–27)
Bilirubin Total: 0.5 mg/dL (ref 0.0–1.2)
CO2: 21 mmol/L (ref 20–29)
Calcium: 9.7 mg/dL (ref 8.6–10.2)
Chloride: 103 mmol/L (ref 96–106)
Creatinine, Ser: 1.04 mg/dL (ref 0.76–1.27)
GFR calc Af Amer: 81 mL/min/{1.73_m2} (ref >59)
GFR calc non Af Amer: 70 mL/min/{1.73_m2} (ref >59)
Globulin, Total: 2.1 g/dL (ref 1.5–4.5)
Glucose: 98 mg/dL (ref 65–99)
Potassium: 4.4 mmol/L (ref 3.5–5.2)
Sodium: 140 mmol/L (ref 134–144)
Total Protein: 6.7 g/dL (ref 6.0–8.5)

## 2021-01-02 LAB — LIPID PANEL
Chol/HDL Ratio: 2.9 ratio (ref 0.0–5.0)
Cholesterol, Total: 180 mg/dL (ref 100–199)
HDL: 62 mg/dL (ref >39)
LDL Chol Calc (NIH): 99 mg/dL (ref 0–99)
Triglycerides: 105 mg/dL (ref 0–149)
VLDL Cholesterol Cal: 19 mg/dL (ref 5–40)

## 2021-01-02 NOTE — Patient Instructions (Signed)

## 2021-01-09 ENCOUNTER — Other Ambulatory Visit: Payer: Self-pay | Admitting: Cardiology

## 2021-01-09 NOTE — Telephone Encounter (Signed)
Refill sent to pharmacy.   

## 2021-02-01 ENCOUNTER — Other Ambulatory Visit: Payer: Self-pay | Admitting: Cardiology

## 2021-02-10 ENCOUNTER — Other Ambulatory Visit: Payer: Self-pay | Admitting: Cardiology

## 2021-02-10 NOTE — Telephone Encounter (Signed)
Diltiazem approved and sent

## 2021-02-28 ENCOUNTER — Telehealth: Payer: Self-pay

## 2021-02-28 NOTE — Telephone Encounter (Signed)
PA started on CMM. Key [id number]. PA approved from 01-29-21 until 02-28-22.

## 2021-06-15 ENCOUNTER — Other Ambulatory Visit: Payer: Self-pay | Admitting: Cardiology

## 2021-07-06 ENCOUNTER — Ambulatory Visit: Payer: 59 | Admitting: Cardiology

## 2021-09-07 ENCOUNTER — Other Ambulatory Visit: Payer: Self-pay | Admitting: Cardiology

## 2021-09-15 ENCOUNTER — Encounter: Payer: Self-pay | Admitting: Cardiology

## 2021-09-15 ENCOUNTER — Other Ambulatory Visit: Payer: Self-pay

## 2021-09-15 ENCOUNTER — Ambulatory Visit (INDEPENDENT_AMBULATORY_CARE_PROVIDER_SITE_OTHER): Payer: 59 | Admitting: Cardiology

## 2021-09-15 VITALS — BP 140/70 | HR 66 | Ht 66.0 in | Wt 164.4 lb

## 2021-09-15 DIAGNOSIS — I10 Essential (primary) hypertension: Secondary | ICD-10-CM | POA: Diagnosis not present

## 2021-09-15 DIAGNOSIS — I700 Atherosclerosis of aorta: Secondary | ICD-10-CM

## 2021-09-15 DIAGNOSIS — E782 Mixed hyperlipidemia: Secondary | ICD-10-CM | POA: Diagnosis not present

## 2021-09-15 NOTE — Progress Notes (Signed)
Cardiology Office Note:    Date:  09/15/2021   ID:  Brad Hamilton, DOB 06/03/1946, MRN [id number]  PCP:  Algis Greenhouse, MD  Cardiologist:  Shirlee More, MD    Referring MD: Algis Greenhouse, MD    ASSESSMENT:    1. Essential hypertension   2. Mixed hyperlipidemia   3. Atherosclerosis of aorta (HCC)    PLAN:    In order of problems listed above:  He continues to do well blood pressures been well controlled with no paroxysms but has not needed clonidine he will continue his current treatment including diltiazem ARB recheck renal function potassium continue to monitor blood pressure at home Stable continue statin and Zetia check lipid profile Continue antihypertensives aspirin statin   Next appointment: 6 months   Medication Adjustments/Labs and Tests Ordered: Current medicines are reviewed at length with the patient today.  Concerns regarding medicines are outlined above.  Orders Placed This Encounter  Procedures   Comprehensive metabolic panel   Lipid panel   No orders of the defined types were placed in this encounter.   Chief Complaint  Patient presents with   Follow-up   Hypertension   Hyperlipidemia     History of Present Illness:    Brad Hamilton is a 75 y.o. male with a hx of hypertension hyperlipidemia intolerance of high sensitivity statins and mild carotid atherosclerosis on duplex in 2018 last seen to 01/02/2021. Compliance with diet, lifestyle and medications: Yes  His blood pressure is well controlled he has had no recent paroxysms of hypertension. 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    Benign hypertension 03/05/2016   Cancer ([place]) 2005   skin cancer on abdomen   Complication of anesthesia    hyper coming out of anesthesia 1987 cystoscopy   Coronary artery calcification seen on CT scan 04/17/2016   Cough    occasional cough with allergies   Essential hypertension 04/17/2016   Foot drop, right    Gastritis 07/18/2017   GERD (gastroesophageal reflux disease)    Hearing loss 07/18/2017   Hyperlipidemia 07/18/2017   Kidney stones 4 yrs ago   passed on own   Screening for diabetes mellitus (DM) 04/19/2016   Skin cancer 07/18/2017   Sleep apnea 07/18/2017    Past Surgical History:  Procedure Laterality Date   [place]  2011   LUMBAR LAMINECTOMY/DECOMPRESSION MICRODISCECTOMY  04/03/2012   Procedure: LUMBAR LAMINECTOMY/DECOMPRESSION MICRODISCECTOMY 1 LEVEL;  Surgeon: Magnus Sinning, MD;  Location: [facility];  Service: Orthopedics;  Laterality: Right;  Microdiscectomy L4 - L5 on the Right (X-Ray)    sinus surgery x 2 1980's      Current Medications: Current Meds  Medication Sig   ANDROGEL PUMP 20.25 MG/ACT (1.62%) GEL Apply 2 application topically 2 (two) times daily.   aspirin EC 81 MG tablet Take 81 mg by mouth daily with breakfast.   buPROPion (WELLBUTRIN XL) 150 MG 24 hr tablet Take 150 mg by mouth daily with breakfast.   calcium carbonate-magnesium hydroxide (ROLAIDS) 334 MG CHEW  Chew 1 tablet by mouth as needed.   CIALIS 20 MG tablet Take 20 mg by mouth daily as needed.   cloNIDine (CATAPRES) 0.1 MG tablet Take 0.1 mg by mouth daily as needed.   diltiazem (CARDIZEM CD) 120 MG 24 hr capsule TAKE 1 CAPSULE BY MOUTH TWICE DAILY.   ezetimibe (ZETIA) 10 MG tablet TAKE 1/2 TABLET BY MOUTH ONCE DAILY.   ibuprofen (ADVIL,MOTRIN) 200 MG tablet Take 400-600 mg by mouth every 6 (six) hours as needed. For pain   LIVALO 2 MG TABS TAKE 1/2 TABLET BY MOUTH TWICE A WEEK ASDIRECTED.    Multiple Vitamins-Minerals (PRESERVISION AREDS 2+MULTI VIT) CAPS Take 1 tablet by mouth 2 (two) times daily.   olmesartan (BENICAR) 20 MG tablet TAKE 1 TABLET BY MOUTH ONCE DAILY. **DO NOT TAKE IF BP IS LESS THAN 120)   OVER THE COUNTER MEDICATION Take by mouth daily with breakfast. Alphaloproic Acid=For immune system   Saw Palmetto 500 MG CAPS Take 500 mg by mouth daily with breakfast.   vitamin C (ASCORBIC ACID) 500 MG tablet Take 500 mg by mouth daily with breakfast.     Allergies:   Statins   Social History   Socioeconomic History   Marital status: Single    Spouse name: Not on file   Number of children: Not on file   Years of education: Not on file   Highest education level: Not on file  Occupational History   Not on file  Tobacco Use   Smoking status: Former    Packs/day: 2.00    Years: 30.00    Pack years: 60.00    Types: Cigarettes    Quit date: 07/12/2000    Years since quitting: 21.1   Smokeless tobacco: Never  Vaping Use   Vaping Use: Never used  Substance and Sexual Activity   Alcohol use: Yes    Alcohol/week: 2.0 standard drinks    Types: 1 Glasses of wine, 1 Cans of beer per week    Comment: 1 beer or wine per day   Drug use: Never   Sexual activity: Yes  Other Topics Concern   Not on file  Social History Narrative   Not on file   Social Determinants of Health   Financial Resource Strain: Not on file  Food Insecurity: Not on file  Transportation Needs: Not on file  Physical Activity: Not on file  Stress: Not on file  Social Connections: Not on file     Family History: The patient's family history includes Bleeding Disorder in his mother; Cancer in his maternal grandmother and mother; Diabetes in his father and mother; Heart failure in his father; Hypertension in his father, mother, and sister; Prostate cancer in his father; Stroke in his maternal grandfather. ROS:   Please see the history of present illness.    All other systems reviewed and are  negative.  EKGs/Labs/Other Studies Reviewed:    The following studies were reviewed today:  EKG:  EKG 7 01/02/2021 sinus rhythm normal  Recent Labs: 01/02/2021: ALT 34; BUN 23; Creatinine, Ser 1.04; Potassium 4.4; Sodium 140  Recent Lipid Panel    Component Value Date/Time   CHOL 180 01/02/2021 0000   TRIG 105 01/02/2021 0000   HDL 62 01/02/2021 0000   CHOLHDL 2.9 01/02/2021 0000   LDLCALC 99 01/02/2021 0000    Physical Exam:    VS:  BP 140/70 (BP Location: Right Arm, Patient Position: Sitting, Cuff Size: Normal)   Pulse 66   Ht 5'  6" (1.676 m)   Wt 164 lb 6.4 oz (74.6 kg)   SpO2 96%   BMI 26.53 kg/m     Wt Readings from Last 3 Encounters:  09/15/21 164 lb 6.4 oz (74.6 kg)  01/02/21 164 lb 12.8 oz (74.8 kg)  01/04/20 170 lb 3.2 oz (77.2 kg)     GEN:  Well nourished, well developed in no acute distress he appears younger than his age 63: Normal NECK: No JVD; No carotid bruits LYMPHATICS: No lymphadenopathy CARDIAC: RRR, no murmurs, rubs, gallops RESPIRATORY:  Clear to auscultation without rales, wheezing or rhonchi  ABDOMEN: Soft, non-tender, non-distended MUSCULOSKELETAL:  No edema; No deformity  SKIN: Warm and dry NEUROLOGIC:  Alert and oriented x 3 PSYCHIATRIC:  Normal affect    Signed, Shirlee More, MD  09/15/2021 4:19 PM    [facility]

## 2021-09-15 NOTE — Patient Instructions (Signed)

## 2021-09-16 DIAGNOSIS — E291 Testicular hypofunction: Secondary | ICD-10-CM | POA: Insufficient documentation

## 2021-09-16 DIAGNOSIS — N529 Male erectile dysfunction, unspecified: Secondary | ICD-10-CM | POA: Insufficient documentation

## 2021-09-16 HISTORY — DX: Testicular hypofunction: E29.1

## 2021-09-16 HISTORY — DX: Male erectile dysfunction, unspecified: N52.9

## 2021-09-16 LAB — COMPREHENSIVE METABOLIC PANEL
ALT: 24 IU/L (ref 0–44)
AST: 22 IU/L (ref 0–40)
Albumin/Globulin Ratio: 2.7 — ABNORMAL HIGH (ref 1.2–2.2)
Albumin: 4.3 g/dL (ref 3.7–4.7)
Alkaline Phosphatase: 139 IU/L — ABNORMAL HIGH (ref 44–121)
BUN/Creatinine Ratio: 18 (ref 10–24)
BUN: 19 mg/dL (ref 8–27)
Bilirubin Total: 0.4 mg/dL (ref 0.0–1.2)
CO2: 25 mmol/L (ref 20–29)
Calcium: 9.3 mg/dL (ref 8.6–10.2)
Chloride: 104 mmol/L (ref 96–106)
Creatinine, Ser: 1.06 mg/dL (ref 0.76–1.27)
Globulin, Total: 1.6 g/dL (ref 1.5–4.5)
Glucose: 138 mg/dL — ABNORMAL HIGH (ref 70–99)
Potassium: 4.1 mmol/L (ref 3.5–5.2)
Sodium: 141 mmol/L (ref 134–144)
Total Protein: 5.9 g/dL — ABNORMAL LOW (ref 6.0–8.5)
eGFR: 73 mL/min/{1.73_m2} (ref >59)

## 2021-09-16 LAB — LIPID PANEL
Chol/HDL Ratio: 3.2 ratio (ref 0.0–5.0)
Cholesterol, Total: 142 mg/dL (ref 100–199)
HDL: 44 mg/dL (ref >39)
LDL Chol Calc (NIH): 51 mg/dL (ref 0–99)
Triglycerides: 310 mg/dL — ABNORMAL HIGH (ref 0–149)
VLDL Cholesterol Cal: 47 mg/dL — ABNORMAL HIGH (ref 5–40)

## 2021-09-18 ENCOUNTER — Telehealth: Payer: Self-pay

## 2021-09-18 NOTE — Telephone Encounter (Signed)
Spoke with patient regarding results and recommendation.  Patient verbalizes understanding and is agreeable to plan of care. Advised patient to call back with any issues or concerns.  

## 2021-09-18 NOTE — Telephone Encounter (Signed)
-----   Message from Richardo Priest, MD sent at 09/16/2021  2:36 PM EDT ----- Normal or stable result  Good result non fasting upper normal TG 250 mildly elevated

## 2021-11-25 ENCOUNTER — Other Ambulatory Visit: Payer: Self-pay | Admitting: Cardiology

## 2022-01-15 ENCOUNTER — Other Ambulatory Visit: Payer: Self-pay | Admitting: Cardiology

## 2022-01-31 ENCOUNTER — Telehealth: Payer: Self-pay

## 2022-01-31 NOTE — Telephone Encounter (Signed)
Prior authorization for Livalo 2 mg tablets ? ?[id number];Review Type:Prior Auth;Coverage Start Date:01/01/2022;Coverage End Date:01/31/2023; ?

## 2022-03-21 ENCOUNTER — Other Ambulatory Visit: Payer: Self-pay | Admitting: Cardiology

## 2022-03-21 NOTE — Telephone Encounter (Signed)
Rx refill sent to pharmacy. 

## 2022-03-29 ENCOUNTER — Ambulatory Visit (INDEPENDENT_AMBULATORY_CARE_PROVIDER_SITE_OTHER): Payer: 59 | Admitting: Cardiology

## 2022-03-29 ENCOUNTER — Encounter: Payer: Self-pay | Admitting: Cardiology

## 2022-03-29 VITALS — BP 132/66 | HR 55 | Ht 66.0 in | Wt 163.0 lb

## 2022-03-29 DIAGNOSIS — E782 Mixed hyperlipidemia: Secondary | ICD-10-CM | POA: Diagnosis not present

## 2022-03-29 DIAGNOSIS — I700 Atherosclerosis of aorta: Secondary | ICD-10-CM

## 2022-03-29 DIAGNOSIS — I10 Essential (primary) hypertension: Secondary | ICD-10-CM | POA: Diagnosis not present

## 2022-03-29 NOTE — Patient Instructions (Signed)
Medication Instructions:  Your physician recommends that you continue on your current medications as directed. Please refer to the Current Medication list given to you today.  *If you need a refill on your cardiac medications before your next appointment, please call your pharmacy*   Lab Work: Your physician recommends that you return for lab work in:   Labs today: CMP, Lipids  If you have labs (blood work) drawn today and your tests are completely normal, you will receive your results only by: MyChart Message (if you have MyChart) OR A paper copy in the mail If you have any lab test that is abnormal or we need to change your treatment, we will call you to review the results.   Testing/Procedures: None   Follow-Up: At [facility], you and your health needs are our priority.  As part of our continuing mission to provide you with exceptional heart care, we have created designated Provider Care Teams.  These Care Teams include your primary Cardiologist (physician) and Advanced Practice Providers (APPs -  Physician Assistants and Nurse Practitioners) who all work together to provide you with the care you need, when you need it.  We recommend signing up for the patient portal called "MyChart".  Sign up information is provided on this After Visit Summary.  MyChart is used to connect with patients for Virtual Visits (Telemedicine).  Patients are able to view lab/test results, encounter notes, upcoming appointments, etc.  Non-urgent messages can be sent to your provider as well.   To learn more about what you can do with MyChart, go to [email or web address].    Your next appointment:   6 month(s)  The format for your next appointment:   In Person  Provider:   Brian Munley, MD    Other Instructions None  Important Information About Sugar       

## 2022-03-29 NOTE — Progress Notes (Signed)
Cardiology Office Note:    Date:  03/29/2022   ID:  Brad Hamilton, DOB Apr 05, 1946, MRN [id number]  PCP:  Algis Greenhouse, MD  Cardiologist:  Shirlee More, MD    Referring MD: Algis Greenhouse, MD    ASSESSMENT:    1. Mixed hyperlipidemia   2. Essential hypertension   3. Atherosclerosis of aorta (HCC)    PLAN:    In order of problems listed above:  Hypertension is well controlled continue his current regimen.  Continue statin check CMP lipid profile today    Next appointment: 6 months   Medication Adjustments/Labs and Tests Ordered: Current medicines are reviewed at length with the patient today.  Concerns regarding medicines are outlined above.  Orders Placed This Encounter  Procedures   Comp Met (CMET)   Lipid Profile   EKG 12-Lead   No orders of the defined types were placed in this encounter.   Chief Complaint  Patient presents with   Follow-up   Hypertension   Hyperlipidemia    History of Present Illness:    Brad Hamilton is a 76 y.o. male with a hx of hypertension hyperlipidemia intolerance of high sensitivity statins and mild carotid atherosclerosis on duplex in 2018  last seen 09/15/2021.  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   Benign hypertension 03/05/2016   Cancer ([place]) 2005   skin cancer on abdomen   Complication of anesthesia    hyper coming out of anesthesia 1987 cystoscopy   Coronary artery calcification seen on CT scan 04/17/2016   Cough    occasional cough  with allergies   Essential hypertension 04/17/2016   Foot drop, right    Gastritis 07/18/2017   GERD (gastroesophageal reflux disease)    Hearing loss 07/18/2017   Hyperlipidemia 07/18/2017   Kidney stones 4 yrs ago   passed on own   Screening for diabetes mellitus (DM) 04/19/2016   Skin cancer 07/18/2017   Sleep apnea 07/18/2017    Past Surgical History:  Procedure Laterality Date   [place]  2011   LUMBAR LAMINECTOMY/DECOMPRESSION MICRODISCECTOMY  04/03/2012   Procedure: LUMBAR LAMINECTOMY/DECOMPRESSION MICRODISCECTOMY 1 LEVEL;  Surgeon: Magnus Sinning, MD;  Location: [facility];  Service: Orthopedics;  Laterality: Right;  Microdiscectomy L4 - L5 on the Right (X-Ray)    sinus surgery x 2 1980's      Current Medications: Current Meds  Medication Sig   ANDROGEL PUMP 20.25 MG/ACT (1.62%) GEL Apply 2 application topically 2 (two) times daily.   aspirin EC 81 MG tablet Take 81 mg by mouth daily with breakfast.   buPROPion (WELLBUTRIN XL) 150 MG 24 hr tablet Take 150 mg by mouth daily with breakfast.   calcium carbonate-magnesium hydroxide (ROLAIDS) 334 MG CHEW Chew 1 tablet by mouth as needed (Heartburn).   CIALIS 20 MG tablet Take 20 mg by mouth daily as needed for erectile dysfunction.   cloNIDine (CATAPRES) 0.1 MG tablet  Take 0.1 mg by mouth daily as needed (Elevated BP).   diltiazem (CARDIZEM CD) 120 MG 24 hr capsule TAKE 1 CAPSULE BY MOUTH TWICE DAILY.   ezetimibe (ZETIA) 10 MG tablet Take 0.5 tablets (5 mg total) by mouth daily.   ibuprofen (ADVIL,MOTRIN) 200 MG tablet Take 400-600 mg by mouth every 6 (six) hours as needed. For pain   LIVALO 2 MG TABS TAKE 1/2 TABLET BY MOUTH TWICE A WEEK ASDIRECTED.   Multiple Vitamins-Minerals (PRESERVISION AREDS 2+MULTI VIT) CAPS Take 1 tablet by mouth 2 (two) times daily.   olmesartan (BENICAR) 20 MG tablet TAKE 1 TABLET BY MOUTH ONCE DAILY. **DO  NOT TAKE IF BP IS LESS THAN 120)   OVER THE COUNTER MEDICATION Take by mouth daily with breakfast. Alphaloproic Acid=For immune system   Saw Palmetto 500 MG CAPS Take 500 mg by mouth daily with breakfast.   vitamin C (ASCORBIC ACID) 500 MG tablet Take 500 mg by mouth daily with breakfast.     Allergies:   Statins   Social History   Socioeconomic History   Marital status: Single    Spouse name: Not on file   Number of children: Not on file   Years of education: Not on file   Highest education level: Not on file  Occupational History   Not on file  Tobacco Use   Smoking status: Former    Packs/day: 2.00    Years: 30.00    Pack years: 60.00    Types: Cigarettes    Quit date: 07/12/2000    Years since quitting: 21.7    Passive exposure: Past   Smokeless tobacco: Never  Vaping Use   Vaping Use: Never used  Substance and Sexual Activity   Alcohol use: Yes    Alcohol/week: 2.0 standard drinks    Types: 1 Glasses of wine, 1 Cans of beer per week    Comment: 1 beer or wine per day   Drug use: Never   Sexual activity: Yes  Other Topics Concern   Not on file  Social History Narrative   Not on file   Social Determinants of Health   Financial Resource Strain: Not on file  Food Insecurity: Not on file  Transportation Needs: Not on file  Physical Activity: Not on file  Stress: Not on file  Social Connections: Not on file     Family History: The patient's family history includes Bleeding Disorder in his mother; Cancer in his maternal grandmother and mother; Diabetes in his father and mother; Heart failure in his father; Hypertension in his father, mother, and sister; Prostate cancer in his father; Stroke in his maternal grandfather. ROS:   Please see the history of present illness.    All other systems reviewed and are negative.  EKGs/Labs/Other Studies Reviewed:    The following studies were reviewed today:  EKG:  EKG ordered today and personally reviewed.  The ekg  ordered today demonstrates sinus bradycardia 85 bpm otherwise normal EKG  Recent Labs: 09/15/2021: ALT 24; BUN 19; Creatinine, Ser 1.06; Potassium 4.1; Sodium 141  Recent Lipid Panel    Component Value Date/Time   CHOL 142 09/15/2021 1519   TRIG 310 (H) 09/15/2021 1519   HDL 44 09/15/2021 1519   CHOLHDL 3.2 09/15/2021 1519   LDLCALC 51 09/15/2021 1519    Physical Exam:    VS:  BP 132/66 (BP Location: Left Arm, Patient Position: Sitting)   Pulse (!) 55   Ht '5\' 6"'  (1.676 m)   Wt  163 lb (73.9 kg)   SpO2 99%   BMI 26.31 kg/m     Wt Readings from Last 3 Encounters:  03/29/22 163 lb (73.9 kg)  09/15/21 164 lb 6.4 oz (74.6 kg)  01/02/21 164 lb 12.8 oz (74.8 kg)     GEN:  Well nourished, well developed in no acute distress HEENT: Normal NECK: No JVD; No carotid bruits LYMPHATICS: No lymphadenopathy CARDIAC: RRR, no murmurs, rubs, gallops RESPIRATORY:  Clear to auscultation without rales, wheezing or rhonchi  ABDOMEN: Soft, non-tender, non-distended MUSCULOSKELETAL:  No edema; No deformity  SKIN: Warm and dry NEUROLOGIC:  Alert and oriented x 3 PSYCHIATRIC:  Normal affect    Signed, Shirlee More, MD  03/29/2022 4:59 PM    [facility]

## 2022-03-30 ENCOUNTER — Telehealth: Payer: Self-pay | Admitting: Cardiology

## 2022-03-30 ENCOUNTER — Telehealth: Payer: Self-pay

## 2022-03-30 DIAGNOSIS — R748 Abnormal levels of other serum enzymes: Secondary | ICD-10-CM

## 2022-03-30 LAB — COMPREHENSIVE METABOLIC PANEL
ALT: 34 IU/L (ref 0–44)
AST: 25 IU/L (ref 0–40)
Albumin/Globulin Ratio: 2 (ref 1.2–2.2)
Albumin: 4.1 g/dL (ref 3.7–4.7)
Alkaline Phosphatase: 155 IU/L — ABNORMAL HIGH (ref 44–121)
BUN/Creatinine Ratio: 25 — ABNORMAL HIGH (ref 10–24)
BUN: 24 mg/dL (ref 8–27)
Bilirubin Total: 0.2 mg/dL (ref 0.0–1.2)
CO2: 22 mmol/L (ref 20–29)
Calcium: 9.4 mg/dL (ref 8.6–10.2)
Chloride: 106 mmol/L (ref 96–106)
Creatinine, Ser: 0.95 mg/dL (ref 0.76–1.27)
Globulin, Total: 2.1 g/dL (ref 1.5–4.5)
Glucose: 84 mg/dL (ref 70–99)
Potassium: 5 mmol/L (ref 3.5–5.2)
Sodium: 142 mmol/L (ref 134–144)
Total Protein: 6.2 g/dL (ref 6.0–8.5)
eGFR: 83 mL/min/{1.73_m2} (ref >59)

## 2022-03-30 LAB — LIPID PANEL
Chol/HDL Ratio: 3.2 ratio (ref 0.0–5.0)
Cholesterol, Total: 133 mg/dL (ref 100–199)
HDL: 42 mg/dL (ref >39)
LDL Chol Calc (NIH): 66 mg/dL (ref 0–99)
Triglycerides: 147 mg/dL (ref 0–149)
VLDL Cholesterol Cal: 25 mg/dL (ref 5–40)

## 2022-03-30 NOTE — Telephone Encounter (Signed)
See above note

## 2022-03-30 NOTE — Telephone Encounter (Signed)
Call transferred to nurse

## 2022-03-30 NOTE — Telephone Encounter (Signed)
-----   Message from Richardo Priest, MD sent at 03/30/2022 12:40 PM EDT ----- Labs are good but an isolated elevation of 1 liver function test.  Although the others are normal I think he should get an ultrasound of his liver performed.

## 2022-03-30 NOTE — Telephone Encounter (Signed)
The patient has been notified of the result and verbalized understanding.  All questions (if any) were answered. Antonieta Iba, RN 03/30/2022 3:17 PM  Liver ultrasound has been ordered.

## 2022-04-10 ENCOUNTER — Other Ambulatory Visit: Payer: Self-pay

## 2022-04-10 MED ORDER — LIVALO 2 MG PO TABS: ORAL_TABLET | ORAL | 3 refills | Status: DC

## 2022-04-16 ENCOUNTER — Ambulatory Visit (HOSPITAL_COMMUNITY): Payer: 59

## 2022-04-17 ENCOUNTER — Ambulatory Visit (HOSPITAL_COMMUNITY)
Admission: RE | Admit: 2022-04-17 | Discharge: 2022-04-17 | Disposition: A | Discharge status: Home / Self Care | Payer: Commercial Managed Care - PPO | Source: Ambulatory Visit | Attending: Cardiology | Admitting: Cardiology

## 2022-04-17 DIAGNOSIS — R748 Abnormal levels of other serum enzymes: Secondary | ICD-10-CM | POA: Diagnosis not present

## 2022-04-20 ENCOUNTER — Telehealth: Payer: Self-pay | Admitting: Cardiology

## 2022-04-20 NOTE — Telephone Encounter (Signed)
Patient is requesting to discuss renal results.

## 2022-04-24 ENCOUNTER — Other Ambulatory Visit: Payer: Self-pay

## 2022-04-24 ENCOUNTER — Telehealth: Payer: Self-pay

## 2022-04-24 DIAGNOSIS — R748 Abnormal levels of other serum enzymes: Secondary | ICD-10-CM

## 2022-04-24 NOTE — Telephone Encounter (Signed)
Called patient to inform him of his results and also to inform him of the liver ultrasound ordered by Dr. Bettina Gavia. Patient did not answer the phone. Left message for patient to call back.

## 2022-04-24 NOTE — Telephone Encounter (Signed)
Left message for patient to call back  

## 2022-04-25 NOTE — Telephone Encounter (Signed)
Patient informed of result.

## 2022-04-28 ENCOUNTER — Encounter: Payer: Self-pay | Admitting: Cardiology

## 2022-05-03 ENCOUNTER — Other Ambulatory Visit: Payer: Self-pay

## 2022-05-03 ENCOUNTER — Ambulatory Visit (HOSPITAL_BASED_OUTPATIENT_CLINIC_OR_DEPARTMENT_OTHER)
Admission: RE | Admit: 2022-05-03 | Discharge: 2022-05-03 | Disposition: A | Discharge status: Home / Self Care | Payer: Commercial Managed Care - PPO | Source: Ambulatory Visit | Attending: Cardiology | Admitting: Cardiology

## 2022-05-03 DIAGNOSIS — R748 Abnormal levels of other serum enzymes: Secondary | ICD-10-CM | POA: Diagnosis present

## 2022-05-07 ENCOUNTER — Encounter: Payer: Self-pay | Admitting: Cardiology

## 2022-05-14 ENCOUNTER — Encounter: Payer: Self-pay | Admitting: Gastroenterology

## 2022-05-14 DIAGNOSIS — D351 Benign neoplasm of parathyroid gland: Secondary | ICD-10-CM

## 2022-05-14 HISTORY — DX: Benign neoplasm of parathyroid gland: D35.1

## 2022-05-24 ENCOUNTER — Other Ambulatory Visit: Payer: Self-pay

## 2022-05-24 DIAGNOSIS — E349 Endocrine disorder, unspecified: Secondary | ICD-10-CM

## 2022-06-25 DIAGNOSIS — E210 Primary hyperparathyroidism: Secondary | ICD-10-CM

## 2022-06-25 HISTORY — DX: Primary hyperparathyroidism: E21.0

## 2022-06-26 ENCOUNTER — Ambulatory Visit (INDEPENDENT_AMBULATORY_CARE_PROVIDER_SITE_OTHER): Payer: Commercial Managed Care - PPO | Admitting: Gastroenterology

## 2022-06-26 ENCOUNTER — Other Ambulatory Visit (INDEPENDENT_AMBULATORY_CARE_PROVIDER_SITE_OTHER): Payer: Commercial Managed Care - PPO

## 2022-06-26 ENCOUNTER — Encounter: Payer: Self-pay | Admitting: Gastroenterology

## 2022-06-26 VITALS — BP 122/78 | HR 58 | Ht 66.0 in | Wt 163.0 lb

## 2022-06-26 DIAGNOSIS — R748 Abnormal levels of other serum enzymes: Secondary | ICD-10-CM

## 2022-06-26 DIAGNOSIS — R9389 Abnormal findings on diagnostic imaging of other specified body structures: Secondary | ICD-10-CM

## 2022-06-26 LAB — CBC WITH DIFFERENTIAL/PLATELET
Basophils Absolute: 0 10*3/uL (ref 0.0–0.1)
Basophils Relative: 0.4 % (ref 0.0–3.0)
Eosinophils Absolute: 0.1 10*3/uL (ref 0.0–0.7)
Eosinophils Relative: 0.8 % (ref 0.0–5.0)
HCT: 41.6 % (ref 39.0–52.0)
Hemoglobin: 13.6 g/dL (ref 13.0–17.0)
Lymphocytes Relative: 20 % (ref 12.0–46.0)
Lymphs Abs: 1.4 10*3/uL (ref 0.7–4.0)
MCHC: 32.7 g/dL (ref 30.0–36.0)
MCV: 100.1 fl — ABNORMAL HIGH (ref 78.0–100.0)
Monocytes Absolute: 0.7 10*3/uL (ref 0.1–1.0)
Monocytes Relative: 10.2 % (ref 3.0–12.0)
Neutro Abs: 4.8 10*3/uL (ref 1.4–7.7)
Neutrophils Relative %: 68.6 % (ref 43.0–77.0)
Platelets: 216 10*3/uL (ref 150.0–400.0)
RBC: 4.16 Mil/uL — ABNORMAL LOW (ref 4.22–5.81)
RDW: 14.4 % (ref 11.5–15.5)
WBC: 6.9 10*3/uL (ref 4.0–10.5)

## 2022-06-26 LAB — COMPREHENSIVE METABOLIC PANEL
ALT: 21 U/L (ref 0–53)
AST: 20 U/L (ref 0–37)
Albumin: 4.3 g/dL (ref 3.5–5.2)
Alkaline Phosphatase: 111 U/L (ref 39–117)
BUN: 23 mg/dL (ref 6–23)
CO2: 29 mEq/L (ref 19–32)
Calcium: 9.5 mg/dL (ref 8.4–10.5)
Chloride: 105 mEq/L (ref 96–112)
Creatinine, Ser: 1.24 mg/dL (ref 0.40–1.50)
GFR: 56.23 mL/min — ABNORMAL LOW (ref >60.00)
Glucose, Bld: 90 mg/dL (ref 70–99)
Potassium: 4.7 mEq/L (ref 3.5–5.1)
Sodium: 140 mEq/L (ref 135–145)
Total Bilirubin: 0.5 mg/dL (ref 0.2–1.2)
Total Protein: 6.6 g/dL (ref 6.0–8.3)

## 2022-06-26 LAB — GAMMA GT: GGT: 23 U/L (ref 7–51)

## 2022-06-26 NOTE — Progress Notes (Signed)
Chief Complaint: abn US/alk phos  Referring Provider:  Algis Greenhouse, MD      ASSESSMENT AND PLAN;   #1. Abn alk phos with abn RUQ US showed mildly dilated CBD. GB with small polyps. R/O any masses.  #2. Primary hyperparathyroidism (being worked up by Dr. Noberto Retort)   Plan: -CBC, CMP, GGT, AMA -MRCP (for abn [place]) -Trend LFTs. If still rising alk phos and further work-up is needed, would consider EUS or liver Bx -FU after MRCP.   HPI:    Brad Hamilton is a 76 y.o. male  Practicing Attorney at [employer] With HTN, recent hyperparathyroidism (seen by Dr. Noberto Retort, being worked up), R adrenal adenoma  Was found to have abnormal alk phos, Nl AST/ALT -126 (48yrago), 139 and then 155 (May 2023) -Isoenzymes were normal  Underwent RUQ [place]2023 which showed slightly dilated CBD 7.3 mm. GB with small polyps.  Advised further work-up by means of MRCP.  No nausea, vomiting, heartburn, regurgitation, odynophagia or dysphagia.  No significant diarrhea or constipation.  No melena or hematochezia. No unintentional weight loss. No abdominal pain.  No H/O itching, skin lesions, easy bruisability, intake of OTC meds including diet pills, herbal medications, anabolic steroids or Tylenol. There is no H/O blood transfusions, IVDA or FH of liver disease. No jaundice, dark urine or pale stools.   No alcohol abuse. He does drink some ETOH- 2 beer/day or glass of wine/day x several years.     Past GI work-up:  RUQ [place]/22/2023 1. 4.2 mm gallbladder polyp. Polyps less than 6 mm do not require imaging follow-up. 2. 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 The gallbladder is otherwise unremarkable. 3. The common bile duct measures 7.3 mm. 6 mm is the upper limits of normal. Recommend clinical correlation and correlation with LFTs. If there is concern for biliary obstruction, recommend MRCP. 4. The known right adrenal adenoma is identified on today's  study measuring 2.2 cm today versus 2.6 cm on previous CT imaging  Colonoscopy with Dr. MLyda Jester8/2020: Diminutive rectal polyp s/p polypectomy, left colonic diverticulosis. Bx- neg. Rpt 5 yrs (optional) per note.  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   Benign hypertension 03/05/2016   Cancer ([place] 2005   skin cancer on abdomen   Complication of anesthesia    hyper coming out of anesthesia 1987 cystoscopy   Coronary artery calcification seen on CT scan 04/17/2016   Cough    occasional cough with allergies   Essential hypertension 04/17/2016   Foot drop, right    Gastritis 07/18/2017   GERD (gastroesophageal reflux disease)    Hearing loss 07/18/2017   Hyperlipidemia 07/18/2017   Kidney stones 4 yrs ago   passed on own   Screening for diabetes mellitus (DM) 04/19/2016   Skin cancer 07/18/2017   Sleep apnea 07/18/2017    Past Surgical History:  Procedure Laterality Date   APPENDECTOMY  11/12/1956   CARDIAC CATHETERIZATION  11/12/1996   CERVICAL LAMINECTOMY  11/13/1975   COLONOSCOPY  06/25/2019   Dr Misenheimer. Diminutive rectal polyp (1) Diverticular disease left colon.   COLONOSCOPY  05/20/2014   Dr MLyda Jester Dimunutive colon polyps (3) Diverticular disease left colon   CYSTOSCOPY  11/12/1985   LAMINECTOMY  11/12/2009   LUMBAR LAMINECTOMY/DECOMPRESSION MICRODISCECTOMY  04/03/2012  Procedure: LUMBAR LAMINECTOMY/DECOMPRESSION MICRODISCECTOMY 1 LEVEL;  Surgeon: Magnus Sinning, MD;  Location: [facility];  Service: Orthopedics;  Laterality: Right;  Microdiscectomy L4 - L5 on the Right (X-Ray)    sinus surgery x 2 1980's      Family History  Problem Relation Age of Onset   Hypertension Mother    Diabetes Mother    Cervical cancer Mother    Bleeding Disorder Mother    Heart disease Mother     Hypertension Father    Diabetes Father    Heart failure Father    Prostate cancer Father    Hypertension Sister    Heart attack Sister    Cancer Maternal Grandmother    Stroke Maternal Grandfather    Colon cancer Neg Hx    Stomach cancer Neg Hx    Esophageal cancer Neg Hx    Colon polyps Neg Hx     Social History   Tobacco Use   Smoking status: Former    Packs/day: 2.00    Years: 30.00    Total pack years: 60.00    Types: Cigarettes    Quit date: 07/12/2000    Years since quitting: 21.9    Passive exposure: Past   Smokeless tobacco: Never  Vaping Use   Vaping Use: Never used  Substance Use Topics   Alcohol use: Yes    Alcohol/week: 2.0 standard drinks of alcohol    Types: 1 Glasses of wine, 1 Cans of beer per week    Comment: 1 beer or wine per day   Drug use: Never    Current Outpatient Medications  Medication Sig Dispense Refill   ANDROGEL PUMP 20.25 MG/ACT (1.62%) GEL Apply 2 application topically 2 (two) times daily.     aspirin EC 81 MG tablet Take 81 mg by mouth daily with breakfast.     buPROPion (WELLBUTRIN XL) 150 MG 24 hr tablet Take 150 mg by mouth daily with breakfast.     calcium carbonate-magnesium hydroxide (ROLAIDS) 334 MG CHEW Chew 1 tablet by mouth as needed (Heartburn).     CIALIS 20 MG tablet Take 20 mg by mouth daily as needed for erectile dysfunction.     cloNIDine (CATAPRES) 0.1 MG tablet Take 0.1 mg by mouth daily as needed (Elevated BP).     diltiazem (CARDIZEM CD) 120 MG 24 hr capsule TAKE 1 CAPSULE BY MOUTH TWICE DAILY. 180 capsule 1   ezetimibe (ZETIA) 10 MG tablet Take 0.5 tablets (5 mg total) by mouth daily. 45 tablet 2   ibuprofen (ADVIL,MOTRIN) 200 MG tablet Take 400-600 mg by mouth every 6 (six) hours as needed. For pain     Multiple Vitamins-Minerals (PRESERVISION AREDS 2+MULTI VIT) CAPS Take 1 tablet by mouth 2 (two) times daily.     olmesartan (BENICAR) 20 MG tablet TAKE 1 TABLET BY MOUTH ONCE DAILY. **DO NOT TAKE IF BP IS LESS  THAN 120) 90 tablet 1   OVER THE COUNTER MEDICATION Take by mouth daily with breakfast. Alphaloproic Acid=For immune system     Pitavastatin Calcium (LIVALO) 2 MG TABS Take 1/2 tablet by mouth twice a week as directed. 12 tablet 3   Saw Palmetto 500 MG CAPS Take 500 mg by mouth daily with breakfast.     sildenafil (VIAGRA) 100 MG tablet Take by mouth.     tretinoin (RETIN-A) 0.05 % cream Apply 1 Application topically at bedtime.     vitamin C (ASCORBIC ACID) 500 MG tablet Take 500 mg by mouth daily with  breakfast.     No current facility-administered medications for this visit.    Allergies  Allergen Reactions   Statins Other (See Comments)    Muscle pain    Review of Systems:  Constitutional: Denies fever, chills, diaphoresis, appetite change and fatigue.  HEENT: Denies photophobia, eye pain, redness, hearing loss, ear pain, congestion, sore throat, rhinorrhea, sneezing, mouth sores, neck pain, neck stiffness and tinnitus.   Respiratory: Denies SOB, DOE, cough, chest tightness,  and wheezing.   Cardiovascular: Denies chest pain, palpitations and leg swelling.  Genitourinary: Denies dysuria, urgency, frequency, hematuria, flank pain and difficulty urinating.  Musculoskeletal: Denies myalgias, back pain, joint swelling, arthralgias and gait problem.  Skin: No rash.  Neurological: Denies dizziness, seizures, syncope, weakness, light-headedness, numbness and headaches.  Hematological: Denies adenopathy. Easy bruising, personal or family bleeding history  Psychiatric/Behavioral: No anxiety or depression     Physical Exam:    BP 122/78   Pulse (!) 58   Ht _0  (1.676 m)   Wt 163 lb (73.9 kg)   BMI 26.31 kg/m  Wt Readings from Last 3 Encounters:  06/26/22 163 lb (73.9 kg)  03/29/22 163 lb (73.9 kg)  09/15/21 164 lb 6.4 oz (74.6 kg)   Constitutional:  Well-developed, in no acute distress. Psychiatric: Normal mood and affect. Behavior is normal. HEENT: Pupils normal.   Conjunctivae are normal. No scleral icterus. Neck supple.  Cardiovascular: Normal rate, regular rhythm. No edema Pulmonary/chest: Effort normal and breath sounds normal. No wheezing, rales or rhonchi. Abdominal: Soft, nondistended. Nontender. Bowel sounds active throughout. There are no masses palpable. No hepatomegaly. Rectal: Deferred Neurological: Alert and oriented to person place and time. Skin: Skin is warm and dry. No rashes noted.  Data Reviewed: I have personally reviewed following labs and imaging studies  CBC:    Latest Ref Rng & Units 04/01/2012    3:00 PM  CBC  WBC 4.0 - 10.5 K/uL 7.2   Hemoglobin 13.0 - 17.0 g/dL 14.1   Hematocrit 39.0 - 52.0 % 40.2   Platelets 150 - 400 K/uL 237     CMP:    Latest Ref Rng & Units 03/29/2022    4:43 PM 09/15/2021    3:19 PM 01/02/2021   12:00 AM  CMP  Glucose 70 - 99 mg/dL 84  138  98   BUN 8 - 27 mg/dL _1 Creatinine 0.76 - 1.27 mg/dL 0.95  1.06  1.04   Sodium 134 - 144 mmol/L 142  141  140   Potassium 3.5 - 5.2 mmol/L 5.0  4.1  4.4   Chloride 96 - 106 mmol/L 106  104  103   CO2 20 - 29 mmol/L _2 Calcium 8.6 - 10.2 mg/dL 9.4  9.3  9.7   Total Protein 6.0 - 8.5 g/dL 6.2  5.9  6.7   Total Bilirubin 0.0 - 1.2 mg/dL 0.2  0.4  0.5   Alkaline Phos 44 - 121 IU/L 155  139  126   AST 0 - 40 IU/L _3 ALT 0 - 44 IU/L 34  24  Omega, MD 06/26/2022, 10:35 AM  Cc: Algis Greenhouse, MD

## 2022-06-26 NOTE — Patient Instructions (Signed)
_______________________________________________________  If you are age 77 or older, your body mass index should be between 23-30. Your Body mass index is 26.31 kg/m. If this is out of the aforementioned range listed, please consider follow up with your Primary Care Provider.  If you are age 19 or younger, your body mass index should be between 19-25. Your Body mass index is 26.31 kg/m. If this is out of the aformentioned range listed, please consider follow up with your Primary Care Provider.   ________________________________________________________  The [place] [facility] providers would like to encourage you to use [facility] to communicate with providers for non-urgent requests or questions.  Due to long hold times on the telephone, sending your provider a message by [facility] may be a faster and more efficient way to get a response.  Please allow 48 business hours for a response.  Please remember that this is for non-urgent requests.  _______________________________________________________  Your provider has requested that you go to the basement level for lab work before leaving today. Press "B" on the elevator. The lab is located at the first door on the left as you exit the elevator.  You have been scheduled for an MRI at [facility], 1st floor, Radiology. Your appointment is scheduled on 07-10-2022 at [facility]. Please arrive 30 minutes prior to your appointment time for registration purposes. Please make certain not to have anything to eat or drink after midnight prior to your test. In addition, if you have any metal in your body, have a pacemaker or defibrillator, please be sure to let your ordering physician know. This test typically takes 45 minutes to 1 hour to complete. Should you need to reschedule, please call [phone].   Thank you,  Dr. Jackquline Denmark

## 2022-06-27 NOTE — Progress Notes (Signed)
Please inform the patient. All results normal or at baseline. Interestingly, alk phos is back to normal.  Still, lets proceed with MRCP as planned  MCV is elevated-please check B12/folate Send report to Dr Garlon Hatchet

## 2022-06-28 ENCOUNTER — Telehealth: Payer: Self-pay | Admitting: Gastroenterology

## 2022-06-28 ENCOUNTER — Other Ambulatory Visit: Payer: Self-pay

## 2022-06-28 DIAGNOSIS — R718 Other abnormality of red blood cells: Secondary | ICD-10-CM

## 2022-06-28 DIAGNOSIS — R9389 Abnormal findings on diagnostic imaging of other specified body structures: Secondary | ICD-10-CM

## 2022-06-28 NOTE — Telephone Encounter (Signed)
Spoke with pt:  Documented in result notes 

## 2022-06-28 NOTE — Telephone Encounter (Signed)
Incoming call from patient inquiring about his chart. Please give a call back to  further advise.  Thank you

## 2022-06-28 NOTE — Telephone Encounter (Signed)
Pt questioned recent labs:  Pt verbalized understanding with all questions answered.

## 2022-06-28 NOTE — Telephone Encounter (Signed)
Patient returned your call. Requesting a call back on his cell.

## 2022-07-02 ENCOUNTER — Other Ambulatory Visit (INDEPENDENT_AMBULATORY_CARE_PROVIDER_SITE_OTHER): Payer: Commercial Managed Care - PPO

## 2022-07-02 DIAGNOSIS — R718 Other abnormality of red blood cells: Secondary | ICD-10-CM | POA: Diagnosis not present

## 2022-07-02 DIAGNOSIS — R9389 Abnormal findings on diagnostic imaging of other specified body structures: Secondary | ICD-10-CM | POA: Diagnosis not present

## 2022-07-02 LAB — B12 AND FOLATE PANEL
Folate: 23.8 ng/mL (ref >5.9)
Vitamin B-12: 403 pg/mL (ref 211–911)

## 2022-07-03 ENCOUNTER — Encounter: Payer: Self-pay | Admitting: Gastroenterology

## 2022-07-03 LAB — MITOCHONDRIAL ANTIBODIES: Mitochondrial M2 Ab, IgG: <=20 U (ref <=20.0)

## 2022-07-09 ENCOUNTER — Other Ambulatory Visit: Payer: Self-pay | Admitting: Gastroenterology

## 2022-07-09 DIAGNOSIS — R748 Abnormal levels of other serum enzymes: Secondary | ICD-10-CM

## 2022-07-09 DIAGNOSIS — R9389 Abnormal findings on diagnostic imaging of other specified body structures: Secondary | ICD-10-CM

## 2022-07-10 ENCOUNTER — Ambulatory Visit (HOSPITAL_COMMUNITY)
Admission: RE | Admit: 2022-07-10 | Discharge: 2022-07-10 | Disposition: A | Discharge status: Home / Self Care | Payer: Commercial Managed Care - PPO | Source: Ambulatory Visit | Attending: Gastroenterology | Admitting: Gastroenterology

## 2022-07-10 DIAGNOSIS — R748 Abnormal levels of other serum enzymes: Secondary | ICD-10-CM | POA: Diagnosis present

## 2022-07-10 DIAGNOSIS — R9389 Abnormal findings on diagnostic imaging of other specified body structures: Secondary | ICD-10-CM | POA: Insufficient documentation

## 2022-07-10 MED ORDER — GADOBUTROL 1 MMOL/ML IV SOLN
7.5000 mL | Freq: Once | INTRAVENOUS | Status: AC | PRN
  Administered 2022-07-10: 7.5 mL via INTRAVENOUS

## 2022-07-10 NOTE — Progress Notes (Signed)
Please inform Brad Hamilton Excellent news MRCP: Neg for any masses.  Mild age-related changes. He also had normal liver tests including alk phos. had normal GGT, AMA, CBC.  Hold off on any further evaluation I would recommend to monitor LFTs every 3 months x 4, then every yearly. If LFTs start rising or if he starts having any symptoms, then only will pursue further eval.  Otherwise hold off.  Send report to Dr Garlon Hatchet and Dr Noberto Retort

## 2022-07-26 DIAGNOSIS — B9789 Other viral agents as the cause of diseases classified elsewhere: Secondary | ICD-10-CM | POA: Insufficient documentation

## 2022-07-26 HISTORY — DX: Other viral agents as the cause of diseases classified elsewhere: B97.89

## 2022-09-10 ENCOUNTER — Other Ambulatory Visit: Payer: Self-pay | Admitting: Cardiology

## 2022-09-10 NOTE — Telephone Encounter (Signed)
Rx refill sent to pharmacy. 

## 2022-10-01 ENCOUNTER — Other Ambulatory Visit: Payer: Self-pay | Admitting: Cardiology

## 2022-10-01 NOTE — Telephone Encounter (Signed)
Refills sent to pharmacy. 

## 2022-10-08 DIAGNOSIS — M79651 Pain in right thigh: Secondary | ICD-10-CM | POA: Insufficient documentation

## 2022-10-08 HISTORY — DX: Pain in right thigh: M79.651

## 2022-10-11 ENCOUNTER — Other Ambulatory Visit (INDEPENDENT_AMBULATORY_CARE_PROVIDER_SITE_OTHER): Payer: Commercial Managed Care - PPO

## 2022-10-11 ENCOUNTER — Telehealth: Payer: Self-pay

## 2022-10-11 ENCOUNTER — Other Ambulatory Visit: Payer: Self-pay

## 2022-10-11 DIAGNOSIS — R9389 Abnormal findings on diagnostic imaging of other specified body structures: Secondary | ICD-10-CM | POA: Diagnosis not present

## 2022-10-11 DIAGNOSIS — R718 Other abnormality of red blood cells: Secondary | ICD-10-CM | POA: Diagnosis not present

## 2022-10-11 LAB — HEPATIC FUNCTION PANEL
ALT: 27 U/L (ref 0–53)
AST: 20 U/L (ref 0–37)
Albumin: 4.1 g/dL (ref 3.5–5.2)
Alkaline Phosphatase: 110 U/L (ref 39–117)
Bilirubin, Direct: 0.1 mg/dL (ref 0.0–0.3)
Total Bilirubin: 0.5 mg/dL (ref 0.2–1.2)
Total Protein: 6.3 g/dL (ref 6.0–8.3)

## 2022-10-11 NOTE — Telephone Encounter (Signed)
Received personal reminder in Epic: Orders for labs placed in Epic:  Left message for pt to call back

## 2022-10-11 NOTE — Telephone Encounter (Signed)
-----   Message from Gillermina Hu, RN sent at 07/11/2022 12:57 PM EDT ----- Regarding: Repeat LFT I would recommend to monitor LFTs every 3 months x 4, then every yearly.  Sent 07/11/2022

## 2022-10-11 NOTE — Telephone Encounter (Signed)
Pt was made aware of Dr. Lyndel Safe recommendations: Order for labs placed in Epic: Pt made aware:Location given to lab:  Personal reminder placed in epic for pt to repeat LFT in three months: Pt made aware: Pt verbalized understanding with all questions answered.

## 2022-12-06 DIAGNOSIS — L03031 Cellulitis of right toe: Secondary | ICD-10-CM | POA: Insufficient documentation

## 2022-12-06 HISTORY — DX: Cellulitis of right toe: L03.031

## 2022-12-27 ENCOUNTER — Ambulatory Visit: Payer: Commercial Managed Care - PPO | Admitting: Cardiology

## 2023-01-02 DIAGNOSIS — L600 Ingrowing nail: Secondary | ICD-10-CM

## 2023-01-02 HISTORY — DX: Ingrowing nail: L60.0

## 2023-01-10 ENCOUNTER — Telehealth: Payer: Self-pay

## 2023-01-10 ENCOUNTER — Other Ambulatory Visit: Payer: Self-pay

## 2023-01-10 DIAGNOSIS — R9389 Abnormal findings on diagnostic imaging of other specified body structures: Secondary | ICD-10-CM

## 2023-01-10 DIAGNOSIS — R0989 Other specified symptoms and signs involving the circulatory and respiratory systems: Secondary | ICD-10-CM

## 2023-01-10 DIAGNOSIS — R718 Other abnormality of red blood cells: Secondary | ICD-10-CM

## 2023-01-10 HISTORY — DX: Other specified symptoms and signs involving the circulatory and respiratory systems: R09.89

## 2023-01-10 NOTE — Telephone Encounter (Signed)
Order for lab entered into Epic.  Left message for pt to call back

## 2023-01-10 NOTE — Telephone Encounter (Signed)
Patient is returning your call.  

## 2023-01-10 NOTE — Telephone Encounter (Signed)
-----   Message from Gillermina Hu, RN sent at 10/11/2022 12:01 PM EST ----- Regarding: Repeat labs in 3 months Personal reminder sent on 10/11/2022    Pt needs repeat labs: 3 month LFT

## 2023-01-11 NOTE — Telephone Encounter (Signed)
Pt made aware of reminder and the need to repeat labs. Order in epic. Pt made aware. Location to lab provided.  Pt verbalized understanding with all questions answered.

## 2023-01-14 ENCOUNTER — Other Ambulatory Visit (INDEPENDENT_AMBULATORY_CARE_PROVIDER_SITE_OTHER): Payer: Commercial Managed Care - PPO

## 2023-01-14 DIAGNOSIS — R9389 Abnormal findings on diagnostic imaging of other specified body structures: Secondary | ICD-10-CM | POA: Diagnosis not present

## 2023-01-14 DIAGNOSIS — R718 Other abnormality of red blood cells: Secondary | ICD-10-CM

## 2023-01-14 LAB — HEPATIC FUNCTION PANEL
ALT: 22 U/L (ref 0–53)
AST: 19 U/L (ref 0–37)
Albumin: 3.8 g/dL (ref 3.5–5.2)
Alkaline Phosphatase: 114 U/L (ref 39–117)
Bilirubin, Direct: 0.1 mg/dL (ref 0.0–0.3)
Total Bilirubin: 0.5 mg/dL (ref 0.2–1.2)
Total Protein: 6.2 g/dL (ref 6.0–8.3)

## 2023-01-16 DIAGNOSIS — I75021 Atheroembolism of right lower extremity: Secondary | ICD-10-CM | POA: Insufficient documentation

## 2023-01-16 HISTORY — DX: Atheroembolism of right lower extremity: I75.021

## 2023-01-17 ENCOUNTER — Telehealth: Payer: Self-pay

## 2023-01-17 NOTE — Telephone Encounter (Signed)
Prior authorization submitted for KAHLE REASER (Key: [id number]) PA Case ID #: [id number] Need Help? Call us at [phone] Outcome Approved today CaseId:[id number];Status:Approved;Review Type:Prior Auth;Coverage Start Date:12/18/2022;Coverage End Date:01/17/2024; Authorization Expiration Date: 01/16/2024 Drug Livalo '2MG'$  tablets

## 2023-01-18 ENCOUNTER — Other Ambulatory Visit: Payer: Self-pay | Admitting: Cardiology

## 2023-02-14 ENCOUNTER — Ambulatory Visit: Payer: Commercial Managed Care - PPO | Admitting: Cardiology

## 2023-03-07 ENCOUNTER — Telehealth: Payer: Self-pay | Admitting: Cardiology

## 2023-03-07 ENCOUNTER — Other Ambulatory Visit: Payer: Self-pay

## 2023-03-07 MED ORDER — PITAVASTATIN CALCIUM 2 MG PO TABS: ORAL_TABLET | ORAL | 3 refills | Status: DC

## 2023-03-07 NOTE — Telephone Encounter (Signed)
Patient needs refill on Livalo sent to [facility] on [address]

## 2023-03-07 NOTE — Telephone Encounter (Signed)
Refilled Livalo  tabs #12 - Pt has appt on 03-13-23 with Dr. Dulce Sellar

## 2023-03-12 NOTE — Progress Notes (Unsigned)
Cardiology Office Note:    Date:  03/13/2023   ID:  Brad Hamilton, DOB 1946-10-28, MRN [id number]  PCP:  Olive Bass, MD  Cardiologist:  Norman Herrlich, MD    Referring MD: Olive Bass, MD    ASSESSMENT:    1. Essential hypertension   2. Atherosclerosis of aorta (HCC)   3. Mixed hyperlipidemia   4. 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  Concerns regarding medicines are outlined above.  No orders of the defined types were placed in this encounter.  No orders of the defined types were placed in this encounter.   Follow-up hypertension hyperlipidemia   History of Present Illness:    Brad Hamilton is a 77 y.o. male with a hx of hypertension hyperlipidemia intolerance of high intensity statin and mild carotid atherosclerosis last seen 03/29/2022.  He was seen 03/08/2023 vascular surgery [facility] for right great toe toe syndrome without clear source he underwent extensive evaluation echocardiogram lower extremity duplex CT chest abdomen pelvis.  CT chest abdomen pelvis showed coronary artery calcification mixed atherosclerotic plaque within the floor abdominal clinical aortic inferior right renal artery.  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  Echocardiogram 03/06/2020 structurally normal heart no valvular abnormality normal  EF 60 to 60% Compliance with diet, lifestyle and medications: Yes  Is a little frustrated he is seen multiple doctors multiple tests done and no answers His maternal resolved spontaneously Present extensive evaluation and told him is conceivable this could be embolic atrial fibrillation talked about a monitor and he prefers to use a self watch and told to self monitor his heart rhythm No edema shortness of breath chest pain palpitation or syncope  Dual antiplatelet therapy 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   Benign hypertension 03/05/2016   Cancer (HCC) 2005   skin cancer on abdomen   Complication of anesthesia    hyper coming out of anesthesia 1987 cystoscopy   Coronary artery calcification seen on CT scan 04/17/2016   Cough    occasional cough with allergies   Essential hypertension 04/17/2016   Foot drop, right    Gastritis 07/18/2017   GERD (gastroesophageal reflux disease)    Hearing loss 07/18/2017   Hyperlipidemia 07/18/2017   Kidney stones 4 yrs ago   passed on own   Screening for diabetes mellitus (DM) 04/19/2016   Skin cancer 07/18/2017   Sleep apnea 07/18/2017    Past Surgical History:  Procedure Laterality Date   APPENDECTOMY  11/12/1956   CARDIAC CATHETERIZATION  11/12/1996   CERVICAL LAMINECTOMY  11/13/1975   COLONOSCOPY  06/25/2019   Dr Misenheimer. Diminutive rectal polyp (1) Diverticular disease left colon.   COLONOSCOPY  05/20/2014   Dr Jennye Boroughs. Dimunutive colon  polyps (3) Diverticular disease left colon   CYSTOSCOPY  11/12/1985   LAMINECTOMY  11/12/2009   LUMBAR LAMINECTOMY/DECOMPRESSION MICRODISCECTOMY  04/03/2012   Procedure: LUMBAR LAMINECTOMY/DECOMPRESSION MICRODISCECTOMY 1 LEVEL;  Surgeon: Drucilla Schmidt, MD;  Location: [facility];  Service: Orthopedics;  Laterality:  Right;  Microdiscectomy L4 - L5 on the Right (X-Ray)    sinus surgery x 2 1980's      Current Medications: Current Meds  Medication Sig   ANDROGEL PUMP 20.25 MG/ACT (1.62%) GEL Apply 2 application topically 2 (two) times daily.   aspirin EC 81 MG tablet Take 81 mg by mouth daily with breakfast.   buPROPion (WELLBUTRIN XL) 150 MG 24 hr tablet Take 150 mg by mouth daily with breakfast.   calcium carbonate-magnesium hydroxide (ROLAIDS) 334 MG CHEW Chew 1 tablet by mouth as needed (Heartburn).   CIALIS 20 MG tablet Take 20 mg by mouth daily as needed for erectile dysfunction.   cloNIDine (CATAPRES) 0.1 MG tablet Take 0.1 mg by mouth daily as needed (Elevated BP).   diltiazem (CARDIZEM CD) 120 MG 24 hr capsule Take 1 capsule (120 mg total) by mouth 2 (two) times daily.   ezetimibe (ZETIA) 10 MG tablet TAKE 1/2 TABLET BY MOUTH ONCE DAILY.   ibuprofen (ADVIL,MOTRIN) 200 MG tablet Take 400-600 mg by mouth every 6 (six) hours as needed. For pain   Multiple Vitamins-Minerals (PRESERVISION AREDS 2+MULTI VIT) CAPS Take 1 tablet by mouth 2 (two) times daily.   olmesartan (BENICAR) 20 MG tablet TAKE 1 TABLET BY MOUTH ONCE DAILY. **DO NOT TAKE IF BP IS LESS THAN 120)   OVER THE COUNTER MEDICATION Take by mouth daily with breakfast. Alphaloproic Acid=For immune system   Pitavastatin Calcium (LIVALO) 2 MG TABS Take 1/2 tablet by mouth twice a week as directed.   Saw Palmetto 500 MG CAPS Take 500 mg by mouth daily with breakfast.   sildenafil (VIAGRA) 100 MG tablet Take by mouth.   tretinoin (RETIN-A) 0.05 % cream Apply 1 Application topically at bedtime.   vitamin C (ASCORBIC ACID) 500 MG tablet Take 500 mg by mouth daily with breakfast.     Allergies:   Statins   Social History   Socioeconomic History   Marital status: Single    Spouse name: Not on file   Number of children: 4   Years of education: Not on file   Highest education level: Not on file  Occupational History   Occupation: lawyer   Tobacco Use   Smoking status: Former    Packs/day: 2.00    Years: 30.00    Additional pack years: 0.00    Total pack years: 60.00    Types: Cigarettes    Quit date: 07/12/2000    Years since quitting: 22.6    Passive exposure: Past   Smokeless tobacco: Never  Vaping Use   Vaping Use: Never used  Substance and Sexual Activity   Alcohol use: Yes    Alcohol/week: 2.0 standard drinks of alcohol    Types: 1 Glasses of wine, 1 Cans of beer per week    Comment: 1 beer or wine per day   Drug use: Never   Sexual activity: Yes  Other Topics Concern   Not on file  Social History Narrative   Not on file   Social Determinants of Health   Financial Resource Strain: Not on file  Food Insecurity: Not on file  Transportation Needs: Not on file  Physical Activity: Not on file  Stress: Not on file  Social Connections: Not on file     Family History: The patient's family history includes Bleeding Disorder in his mother; Cancer in his maternal grandmother; Cervical cancer in his mother; Diabetes in his father and mother; Heart attack in his sister; Heart disease in his mother; Heart failure in his father; Hypertension in his father, mother, and sister; Prostate cancer in his father; Stroke in his maternal grandfather. There is no history of Colon cancer, Stomach cancer, Esophageal cancer, or Colon polyps. ROS:   Please see the history of present illness.    All other systems reviewed and are negative.  EKGs/Labs/Other Studies Reviewed:    The following studies were reviewed today:      EKG:  EKG ordered today and personally reviewed.  The ekg ordered today demonstrates sinus rhythm normal EKG  Recent Labs: 06/26/2022: BUN 23; Creatinine, Ser 1.24; Hemoglobin 13.6; Platelets 216.0; Potassium 4.7; Sodium 140 01/14/2023: ALT 22  Recent Lipid Panel    Component Value Date/Time   CHOL 133 03/29/2022 1643   TRIG 147 03/29/2022 1643   HDL 42 03/29/2022 1643   CHOLHDL 3.2 03/29/2022 1643    LDLCALC 66 03/29/2022 1643    Physical Exam:    VS:  BP (!) 150/74 (BP Location: Right Arm, Patient Position: Sitting, Cuff Size: Normal)   Pulse 60   Ht 5\' 6"  (1.676 m)   Wt 164 lb (74.4 kg)   SpO2 97%   BMI 26.47 kg/m     Wt Readings from Last 3 Encounters:  03/13/23 164 lb (74.4 kg)  06/26/22 163 lb (73.9 kg)  03/29/22 163 lb (73.9 kg)     GEN:  Well nourished, well developed in no acute distress HEENT: Normal NECK: No JVD; No carotid bruits LYMPHATICS: No lymphadenopathy CARDIAC: RRR, no murmurs, rubs, gallops RESPIRATORY:  Clear to auscultation without rales, wheezing or rhonchi  ABDOMEN: Soft, non-tender, non-distended MUSCULOSKELETAL:  No edema; No deformity  SKIN: Warm and dry NEUROLOGIC:  Alert and oriented x 3 PSYCHIATRIC:  Normal affect    Signed, Norman Herrlich, MD  03/13/2023 2:51 PM    [facility]

## 2023-03-13 ENCOUNTER — Ambulatory Visit: Payer: Commercial Managed Care - PPO | Attending: Cardiology | Admitting: Cardiology

## 2023-03-13 ENCOUNTER — Encounter: Payer: Self-pay | Admitting: Cardiology

## 2023-03-13 VITALS — BP 120/80 | HR 60 | Ht 66.0 in | Wt 164.0 lb

## 2023-03-13 DIAGNOSIS — E782 Mixed hyperlipidemia: Secondary | ICD-10-CM

## 2023-03-13 DIAGNOSIS — I10 Essential (primary) hypertension: Secondary | ICD-10-CM

## 2023-03-13 DIAGNOSIS — I75029 Atheroembolism of unspecified lower extremity: Secondary | ICD-10-CM

## 2023-03-13 DIAGNOSIS — I700 Atherosclerosis of aorta: Secondary | ICD-10-CM

## 2023-03-13 NOTE — Patient Instructions (Signed)
Medication Instructions:  Your physician recommends that you continue on your current medications as directed. Please refer to the Current Medication list given to you today.  *If you need a refill on your cardiac medications before your next appointment, please call your pharmacy*   Lab Work: Your physician recommends that you return for lab work in:   Labs today: Lipid   If you have labs (blood work) drawn today and your tests are completely normal, you will receive your results only by: MyChart Message (if you have MyChart) OR A paper copy in the mail If you have any lab test that is abnormal or we need to change your treatment, we will call you to review the results.   Testing/Procedures: None   Follow-Up: At [facility], you and your health needs are our priority.  As part of our continuing mission to provide you with exceptional heart care, we have created designated Provider Care Teams.  These Care Teams include your primary Cardiologist (physician) and Advanced Practice Providers (APPs -  Physician Assistants and Nurse Practitioners) who all work together to provide you with the care you need, when you need it.  We recommend signing up for the patient portal called "MyChart".  Sign up information is provided on this After Visit Summary.  MyChart is used to connect with patients for Virtual Visits (Telemedicine).  Patients are able to view lab/test results, encounter notes, upcoming appointments, etc.  Non-urgent messages can be sent to your provider as well.   To learn more about what you can do with MyChart, go to [email or web address].    Your next appointment:   6 month(s)  Provider:   Norman Herrlich, MD    Other Instructions Get Apple Watch to monitor for atrial fibrillation

## 2023-03-14 LAB — LIPID PANEL
Chol/HDL Ratio: 3.1 ratio (ref 0.0–5.0)
Cholesterol, Total: 157 mg/dL (ref 100–199)
HDL: 50 mg/dL (ref >39)
LDL Chol Calc (NIH): 75 mg/dL (ref 0–99)
Triglycerides: 189 mg/dL — ABNORMAL HIGH (ref 0–149)
VLDL Cholesterol Cal: 32 mg/dL (ref 5–40)

## 2023-04-02 DIAGNOSIS — L851 Acquired keratosis [keratoderma] palmaris et plantaris: Secondary | ICD-10-CM | POA: Insufficient documentation

## 2023-04-02 HISTORY — DX: Acquired keratosis (keratoderma) palmaris et plantaris: L85.1

## 2023-04-04 ENCOUNTER — Other Ambulatory Visit: Payer: Self-pay | Admitting: Cardiology

## 2023-04-04 NOTE — Telephone Encounter (Signed)
Rx sent to pharmacy   

## 2023-04-18 ENCOUNTER — Other Ambulatory Visit: Payer: Self-pay | Admitting: Cardiology

## 2023-07-10 ENCOUNTER — Other Ambulatory Visit: Payer: Self-pay | Admitting: Cardiology

## 2023-11-15 ENCOUNTER — Other Ambulatory Visit: Payer: Self-pay | Admitting: Cardiology

## 2023-11-15 DIAGNOSIS — R1010 Upper abdominal pain, unspecified: Secondary | ICD-10-CM

## 2023-11-15 HISTORY — DX: Upper abdominal pain, unspecified: R10.10

## 2023-11-15 MED ORDER — PITAVASTATIN CALCIUM 2 MG PO TABS: ORAL_TABLET | ORAL | 1 refills | Status: DC

## 2023-11-28 ENCOUNTER — Ambulatory Visit: Payer: Commercial Managed Care - PPO | Admitting: Gastroenterology

## 2023-11-28 NOTE — Progress Notes (Deleted)
Chief Complaint: Primary GI MD: Dr. Chales Abrahams  HPI: 78 year old male history of hypertension and others as listed below presents for evaluation of  Patient was seen August 2023 by Dr. Chales Abrahams.  He was found to have elevated alk phos with normal isoenzymes.  Normal GGT, AMA, CBC.  RUQ ultrasound showed mildly dilated CBD and gallbladder with small polyps.  MRCP was pursued 07/10/2022 which showed mild prominence of extrahepatic biliary tree without choledocholithiasis or suspicious lesion and measuring within normal limits for patient's age.  Normal pancreas without pancreatic ductal dilation.  Stable 2.3 cm right adrenal adenoma considered to be benign.  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  Recent labs 11/15/2023 show alk phos 114 (improved from previous studies), otherwise normal.  Lipase 47  Discussed the use of AI scribe software for clinical note transcription with the patient, who gave verbal consent to proceed.  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   Benign hypertension 03/05/2016   Cancer (HCC) 2005   skin cancer on abdomen   Complication of anesthesia    hyper coming out of anesthesia 1987 cystoscopy   Coronary artery calcification seen on CT scan 04/17/2016   Cough    occasional cough with allergies   Essential hypertension 04/17/2016   Foot drop, right    Gastritis 07/18/2017   GERD (gastroesophageal reflux disease)    Hearing loss 07/18/2017   Hyperlipidemia 07/18/2017   Kidney stones 4 yrs ago   passed on own   Screening for diabetes mellitus (DM) 04/19/2016   Skin cancer 07/18/2017   Sleep apnea 07/18/2017    Past Surgical History:  Procedure Laterality Date   APPENDECTOMY  11/12/1956   CARDIAC CATHETERIZATION   11/12/1996   CERVICAL LAMINECTOMY  11/13/1975   COLONOSCOPY  06/25/2019   Dr Misenheimer. Diminutive rectal polyp (1) Diverticular disease left colon.   COLONOSCOPY  05/20/2014   Dr Jennye Boroughs. Dimunutive colon polyps (3) Diverticular disease left colon   CYSTOSCOPY  11/12/1985   LAMINECTOMY  11/12/2009   LUMBAR LAMINECTOMY/DECOMPRESSION MICRODISCECTOMY  04/03/2012   Procedure: LUMBAR LAMINECTOMY/DECOMPRESSION MICRODISCECTOMY 1 LEVEL;  Surgeon: Drucilla Schmidt, MD;  Location: [facility];  Service: Orthopedics;  Laterality: Right;  Microdiscectomy L4 - L5 on the Right (X-Ray)    sinus surgery x 2 1980's      Current Outpatient Medications  Medication Sig Dispense Refill   ANDROGEL PUMP 20.25 MG/ACT (1.62%) GEL Apply 2 application topically 2 (two) times daily.     aspirin EC 81 MG tablet Take 81 mg by mouth daily with breakfast.     buPROPion (WELLBUTRIN XL) 150 MG 24 hr tablet Take 150 mg by mouth daily with breakfast.     calcium carbonate-magnesium hydroxide (ROLAIDS) 334 MG CHEW Chew 1 tablet by mouth as needed (Heartburn).     CIALIS 20 MG tablet Take 20 mg by mouth daily as needed for erectile dysfunction.     cloNIDine (CATAPRES) 0.1 MG tablet Take 0.1 mg by mouth daily as needed (Elevated BP).     diltiazem (CARDIZEM CD) 120 MG 24 hr capsule Take 1 capsule by mouth twice daily. 180 capsule 3   ezetimibe (ZETIA) 10 MG tablet Take 1/2 tablet by  mouth once daily 45 tablet 2   ibuprofen (ADVIL,MOTRIN) 200 MG tablet Take 400-600 mg by mouth every 6 (six) hours as needed. For pain     Multiple Vitamins-Minerals (PRESERVISION AREDS 2+MULTI VIT) CAPS Take 1 tablet by mouth 2 (two) times daily.     olmesartan (BENICAR) 20 MG tablet TAKE 1 TABLET BY MOUTH ONCE DAILY. **DO NOT TAKE IF BP IS LESS THAN 120) 90 tablet 0   OVER THE COUNTER MEDICATION Take by mouth daily with breakfast. Alphaloproic Acid=For immune system     Pitavastatin Calcium (LIVALO) 2 MG TABS Take 1/2 tablet by mouth twice a  week as directed. 12 tablet 1   Saw Palmetto 500 MG CAPS Take 500 mg by mouth daily with breakfast.     sildenafil (VIAGRA) 100 MG tablet Take by mouth.     tretinoin (RETIN-A) 0.05 % cream Apply 1 Application topically at bedtime.     vitamin C (ASCORBIC ACID) 500 MG tablet Take 500 mg by mouth daily with breakfast.     No current facility-administered medications for this visit.    Allergies as of 11/28/2023 - Review Complete 03/13/2023  Allergen Reaction Noted   Statins Other (See Comments) 02/23/2013    Family History  Problem Relation Age of Onset   Hypertension Mother    Diabetes Mother    Cervical cancer Mother    Bleeding Disorder Mother    Heart disease Mother    Hypertension Father    Diabetes Father    Heart failure Father    Prostate cancer Father    Hypertension Sister    Heart attack Sister    Cancer Maternal Grandmother    Stroke Maternal Grandfather    Colon cancer Neg Hx    Stomach cancer Neg Hx    Esophageal cancer Neg Hx    Colon polyps Neg Hx     Social History   Socioeconomic History   Marital status: Single    Spouse name: Not on file   Number of children: 4   Years of education: Not on file   Highest education level: Not on file  Occupational History   Occupation: lawyer  Tobacco Use   Smoking status: Former    Current packs/day: 0.00    Average packs/day: 2.0 packs/day for 30.0 years (60.0 ttl pk-yrs)    Types: Cigarettes    Start date: 07/12/1970    Quit date: 07/12/2000    Years since quitting: 23.3    Passive exposure: Past   Smokeless tobacco: Never  Vaping Use   Vaping status: Never Used  Substance and Sexual Activity   Alcohol use: Yes    Alcohol/week: 2.0 standard drinks of alcohol    Types: 1 Glasses of wine, 1 Cans of beer per week    Comment: 1 beer or wine per day   Drug use: Never   Sexual activity: Yes  Other Topics Concern   Not on file  Social History Narrative   Not on file   Social Drivers of Health    Financial Resource Strain: Not on file  Food Insecurity: Low Risk  (11/15/2023)   Received from [facility]   Hunger Vital Sign    Worried About Running Out of Food in the Last Year: Never true    Ran Out of Food in the Last Year: Never true  Transportation Needs: No Transportation Needs (11/15/2023)   Received from [facility]    In the past 12 months, has lack  of reliable transportation kept you from medical appointments, meetings, work or from getting things needed for daily living? : No  Physical Activity: Not on file  Stress: Not on file  Social Connections: Not on file  Intimate Partner Violence: Not on file    Review of Systems:    Constitutional: No weight loss, fever, chills, weakness or fatigue HEENT: Eyes: No change in vision               Ears, Nose, Throat:  No change in hearing or congestion Skin: No rash or itching Cardiovascular: No chest pain, chest pressure or palpitations   Respiratory: No SOB or cough Gastrointestinal: See HPI and otherwise negative Genitourinary: No dysuria or change in urinary frequency Neurological: No headache, dizziness or syncope Musculoskeletal: No new muscle or joint pain Hematologic: No bleeding or bruising Psychiatric: No history of depression or anxiety    Physical Exam:  Vital signs: There were no vitals taken for this visit.  Constitutional: NAD, Well developed, Well nourished, alert and cooperative Head:  Normocephalic and atraumatic. Eyes:   PEERL, EOMI. No icterus. Conjunctiva pink. Respiratory: Respirations even and unlabored. Lungs clear to auscultation bilaterally.   No wheezes, crackles, or rhonchi.  Cardiovascular:  Regular rate and rhythm. No peripheral edema, cyanosis or pallor.  Gastrointestinal:  Soft, nondistended, nontender. No rebound or guarding. Normal bowel sounds. No appreciable masses or hepatomegaly. Rectal:  Not performed.  Msk:  Symmetrical without gross deformities. Without edema,  no deformity or joint abnormality.  Neurologic:  Alert and  oriented x4;  grossly normal neurologically.  Skin:   Dry and intact without significant lesions or rashes. Psychiatric: Oriented to person, place and time. Demonstrates good judgement and reason without abnormal affect or behaviors.  Physical Exam           RELEVANT LABS AND IMAGING: CBC    Component Value Date/Time   WBC 6.9 06/26/2022 1109   RBC 4.16 (L) 06/26/2022 1109   HGB 13.6 06/26/2022 1109   HCT 41.6 06/26/2022 1109   PLT 216.0 06/26/2022 1109   MCV 100.1 (H) 06/26/2022 1109   MCH 32.9 04/01/2012 1500   MCHC 32.7 06/26/2022 1109   RDW 14.4 06/26/2022 1109   LYMPHSABS 1.4 06/26/2022 1109   MONOABS 0.7 06/26/2022 1109   EOSABS 0.1 06/26/2022 1109   BASOSABS 0.0 06/26/2022 1109    CMP     Component Value Date/Time   NA 140 06/26/2022 1109   NA 142 03/29/2022 1643   K 4.7 06/26/2022 1109   CL 105 06/26/2022 1109   CO2 29 06/26/2022 1109   GLUCOSE 90 06/26/2022 1109   BUN 23 06/26/2022 1109   BUN 24 03/29/2022 1643   CREATININE 1.24 06/26/2022 1109   CALCIUM 9.5 06/26/2022 1109   PROT 6.2 01/14/2023 1354   PROT 6.2 03/29/2022 1643   ALBUMIN 3.8 01/14/2023 1354   ALBUMIN 4.1 03/29/2022 1643   AST 19 01/14/2023 1354   ALT 22 01/14/2023 1354   ALKPHOS 114 01/14/2023 1354   BILITOT 0.5 01/14/2023 1354   BILITOT 0.2 03/29/2022 1643   GFRNONAA 70 01/02/2021 0000   GFRAA 81 01/02/2021 0000     Assessment/Plan:   Assessment and Plan                Corene Resnick Jolee Ewing [facility] 11/28/2023, 10:26 AM  Cc: Olive Bass, MD

## 2024-01-09 ENCOUNTER — Other Ambulatory Visit (HOSPITAL_COMMUNITY): Payer: Self-pay

## 2024-01-09 ENCOUNTER — Telehealth: Payer: Self-pay | Admitting: Pharmacy Technician

## 2024-01-09 NOTE — Telephone Encounter (Signed)
 Pharmacy Patient Advocate Encounter   Received notification from CoverMyMeds that prior authorization for Livalo is required/requested.   Insurance verification completed.   The patient is insured through  [employer]  .   Per test claim: Refill too soon. PA is not needed at this time. Medication was filled 01/09/24. Next eligible fill date is 01/29/24.

## 2024-01-13 ENCOUNTER — Ambulatory Visit: Payer: Commercial Managed Care - PPO | Admitting: Gastroenterology

## 2024-01-15 ENCOUNTER — Ambulatory Visit (INDEPENDENT_AMBULATORY_CARE_PROVIDER_SITE_OTHER): Admitting: Gastroenterology

## 2024-01-15 ENCOUNTER — Encounter: Payer: Self-pay | Admitting: Gastroenterology

## 2024-01-15 VITALS — BP 124/60 | HR 68 | Ht 64.0 in | Wt 164.5 lb

## 2024-01-15 DIAGNOSIS — E210 Primary hyperparathyroidism: Secondary | ICD-10-CM

## 2024-01-15 DIAGNOSIS — R972 Elevated prostate specific antigen [PSA]: Secondary | ICD-10-CM | POA: Diagnosis not present

## 2024-01-15 DIAGNOSIS — R935 Abnormal findings on diagnostic imaging of other abdominal regions, including retroperitoneum: Secondary | ICD-10-CM

## 2024-01-15 DIAGNOSIS — R748 Abnormal levels of other serum enzymes: Secondary | ICD-10-CM

## 2024-01-15 DIAGNOSIS — R9389 Abnormal findings on diagnostic imaging of other specified body structures: Secondary | ICD-10-CM

## 2024-01-15 NOTE — Progress Notes (Signed)
 Chief Complaint: abn US/alk phos  Referring Provider:  Olive Bass, MD      ASSESSMENT AND PLAN;   #1. Abn alk phos with abn RUQ US showed mildly dilated CBD. GB with small polyps. R/O any masses. Neg MRCP 06/2022  #2.  Elevated PSA -awaiting prostate biopsy by Dr. Annabell Howells.    Plan: -Rpt MRCP -Trend LFTs. If still rising alk phos and further work-up is needed, would consider EUS or liver Bx -FU in 6 months   HPI:    Brad Hamilton is a 78 y.o. male  Practicing Attorney at [employer] With HTN, recent hyperparathyroidism (seen by Dr. Georgiana Shore, being worked up), R adrenal adenoma  Elevated PSA- awaiting prostate Bx by Dr. Annabell Howells  No significant GI complaints.  Most recent alkaline phosphatase was 114.  He is quite concerned about the same.  Was found to have abnormal alk phos, Nl AST/ALT -126 (59yr ago), 139 and then 155 (May 2023) -Isoenzymes were normal  Underwent RUQ [place] 04/2022 which showed slightly dilated CBD 7.3 mm. GB with small polyps.  Advised further work-up by means of MRCP.  No nausea, vomiting, heartburn, regurgitation, odynophagia or dysphagia.  No significant diarrhea or constipation.  No melena or hematochezia. No unintentional weight loss. No abdominal pain.  No H/O itching, skin lesions, easy bruisability, intake of OTC meds including diet pills, herbal medications, anabolic steroids or Tylenol. There is no H/O blood transfusions, IVDA or FH of liver disease. No jaundice, dark urine or pale stools.   No alcohol abuse. He does drink some ETOH- 2 beer/day or glass of wine/day x several years.     Past GI work-up:  RUQ [place] 05/03/2022 1. 4.2 mm gallbladder polyp. Polyps less than 6 mm do not require imaging follow-up. 2. 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 The gallbladder is otherwise unremarkable. 3. The common bile duct measures 7.3 mm. 6 mm is the upper limits of normal. Recommend clinical correlation and  correlation with LFTs. If there is concern for biliary obstruction, recommend MRCP. 4. The known right adrenal adenoma is identified on today's study measuring 2.2 cm today versus 2.6 cm on previous CT imaging  MRCP 07/10/2022 1. Mild prominence of the extrahepatic biliary tree without choledocholithiasis or suspicious lesion identified and measuring within normal limits for patient's age most consistent with senescent change. 2. Stable 2.3 cm right adrenal adenoma which is considered benign and requires no independent imaging follow-up. 3. Colonic diverticulosis without findings of acute diverticulitis.  Colonoscopy with Dr. Jennye Boroughs 06/2019: Diminutive rectal polyp s/p polypectomy, left colonic diverticulosis. Bx- neg. Rpt 5 yrs (optional) per note.  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   Benign hypertension 03/05/2016   Cancer (HCC) 2005   skin cancer on abdomen   Complication of anesthesia    hyper coming out of anesthesia 1987 cystoscopy   Coronary artery calcification seen on CT scan 04/17/2016   Cough    occasional cough with allergies   Essential hypertension 04/17/2016   Foot drop, right    Gastritis 07/18/2017   GERD (gastroesophageal reflux disease)    Hearing loss 07/18/2017   Hyperlipidemia 07/18/2017   Kidney stones 4 yrs ago   passed on own   Screening for diabetes mellitus (DM) 04/19/2016   Skin cancer  07/18/2017   Sleep apnea 07/18/2017    Past Surgical History:  Procedure Laterality Date   APPENDECTOMY  11/12/1956   CARDIAC CATHETERIZATION  11/12/1996   CERVICAL LAMINECTOMY  11/13/1975   COLONOSCOPY  06/25/2019   Dr Misenheimer. Diminutive rectal polyp (1) Diverticular disease left colon.   COLONOSCOPY  05/20/2014   Dr Jennye Boroughs. Dimunutive colon polyps (3) Diverticular  disease left colon   CYSTOSCOPY  11/12/1985   LAMINECTOMY  11/12/2009   LUMBAR LAMINECTOMY/DECOMPRESSION MICRODISCECTOMY  04/03/2012   Procedure: LUMBAR LAMINECTOMY/DECOMPRESSION MICRODISCECTOMY 1 LEVEL;  Surgeon: Drucilla Schmidt, MD;  Location: [facility];  Service: Orthopedics;  Laterality: Right;  Microdiscectomy L4 - L5 on the Right (X-Ray)    sinus surgery x 2 1980's      Family History  Problem Relation Age of Onset   Hypertension Mother    Diabetes Mother    Cervical cancer Mother    Bleeding Disorder Mother    Heart disease Mother    Hypertension Father    Diabetes Father    Heart failure Father    Prostate cancer Father    Hypertension Sister    Heart attack Sister    Cancer Maternal Grandmother    Stroke Maternal Grandfather    Colon cancer Neg Hx    Stomach cancer Neg Hx    Esophageal cancer Neg Hx    Colon polyps Neg Hx     Social History   Tobacco Use   Smoking status: Former    Current packs/day: 0.00    Average packs/day: 2.0 packs/day for 30.0 years (60.0 ttl pk-yrs)    Types: Cigarettes    Start date: 07/12/1970    Quit date: 07/12/2000    Years since quitting: 23.5    Passive exposure: Past   Smokeless tobacco: Never  Vaping Use   Vaping status: Never Used  Substance Use Topics   Alcohol use: Yes    Alcohol/week: 2.0 standard drinks of alcohol    Types: 1 Glasses of wine, 1 Cans of beer per week    Comment: 1 beer or wine per day   Drug use: Never    Current Outpatient Medications  Medication Sig Dispense Refill   ANDROGEL PUMP 20.25 MG/ACT (1.62%) GEL Apply 2 application topically 2 (two) times daily.     aspirin EC 81 MG tablet Take 81 mg by mouth daily with breakfast.     buPROPion (WELLBUTRIN XL) 150 MG 24 hr tablet Take 150 mg by mouth daily with breakfast.     Ca Carbonate-Mag Hydroxide 550-110 MG CHEW Chew 1-2 tablets by mouth as needed.     calcium carbonate-magnesium hydroxide (ROLAIDS) 334 MG CHEW Chew 1 tablet by mouth as needed  (Heartburn).     CIALIS 20 MG tablet Take 20 mg by mouth daily as needed for erectile dysfunction.     cloNIDine (CATAPRES) 0.1 MG tablet Take 0.1 mg by mouth daily as needed (Elevated BP).     diltiazem (CARDIZEM CD) 120 MG 24 hr capsule Take 1 capsule by mouth twice daily. 180 capsule 3   ezetimibe (ZETIA) 10 MG tablet Take 1/2 tablet by mouth once daily 45 tablet 2   ibuprofen (ADVIL,MOTRIN) 200 MG tablet Take 400-600 mg by mouth every 6 (six) hours as needed. For pain     minoxidil (ROGAINE) 2 % external solution Apply 1 Application topically daily.     Multiple Vitamins-Minerals (PRESERVISION AREDS 2+MULTI VIT) CAPS Take 1 tablet by mouth 2 (two) times daily.  olmesartan (BENICAR) 20 MG tablet TAKE 1 TABLET BY MOUTH ONCE DAILY. **DO NOT TAKE IF BP IS LESS THAN 120) 90 tablet 0   OVER THE COUNTER MEDICATION Take by mouth daily with breakfast. Alphaloproic Acid=For immune system     Pitavastatin Calcium (LIVALO) 2 MG TABS Take 1/2 tablet by mouth twice a week as directed. 12 tablet 1   Saw Palmetto 500 MG CAPS Take 500 mg by mouth daily with breakfast.     sildenafil (VIAGRA) 100 MG tablet Take by mouth.     tretinoin (RETIN-A) 0.05 % cream Apply 1 Application topically at bedtime.     vitamin C (ASCORBIC ACID) 500 MG tablet Take 500 mg by mouth daily with breakfast.     No current facility-administered medications for this visit.    Allergies  Allergen Reactions   Statins Other (See Comments)    Muscle pain   Zanaflex [Tizanidine]     Other Reaction(s): Other (See Comments)  BP dropped, vertigo after single 4 mg dose, wore off    Review of Systems:  Constitutional: Denies fever, chills, diaphoresis, appetite change and fatigue.  HEENT: Denies photophobia, eye pain, redness, hearing loss, ear pain, congestion, sore throat, rhinorrhea, sneezing, mouth sores, neck pain, neck stiffness and tinnitus.   Respiratory: Denies SOB, DOE, cough, chest tightness,  and wheezing.    Cardiovascular: Denies chest pain, palpitations and leg swelling.  Genitourinary: Denies dysuria, urgency, frequency, hematuria, flank pain and difficulty urinating.  Musculoskeletal: Denies myalgias, back pain, joint swelling, arthralgias and gait problem.  Skin: No rash.  Neurological: Denies dizziness, seizures, syncope, weakness, light-headedness, numbness and headaches.  Hematological: Denies adenopathy. Easy bruising, personal or family bleeding history  Psychiatric/Behavioral: No anxiety or depression     Physical Exam:    BP 124/60 (BP Location: Left Arm, Patient Position: Sitting, Cuff Size: Normal)   Pulse 68   Ht 5\' 4"  (1.626 m)   Wt 164 lb 8 oz (74.6 kg)   BMI 28.24 kg/m  Wt Readings from Last 3 Encounters:  01/15/24 164 lb 8 oz (74.6 kg)  03/13/23 164 lb (74.4 kg)  06/26/22 163 lb (73.9 kg)   Constitutional:  Well-developed, in no acute distress. Psychiatric: Normal mood and affect. Behavior is normal. HEENT: Pupils normal.  Conjunctivae are normal. No scleral icterus. Neck supple.  Cardiovascular: Normal rate, regular rhythm. No edema Pulmonary/chest: Effort normal and breath sounds normal. No wheezing, rales or rhonchi. Abdominal: Soft, nondistended. Nontender. Bowel sounds active throughout. There are no masses palpable. No hepatomegaly. Rectal: Deferred Neurological: Alert and oriented to person place and time. Skin: Skin is warm and dry. No rashes noted.  Data Reviewed: I have personally reviewed following labs and imaging studies  CBC:    Latest Ref Rng & Units 06/26/2022   11:09 AM 04/01/2012    3:00 PM  CBC  WBC 4.0 - 10.5 K/uL 6.9  7.2   Hemoglobin 13.0 - 17.0 g/dL 16.1  09.6   Hematocrit 39.0 - 52.0 % 41.6  40.2   Platelets 150.0 - 400.0 K/uL 216.0  237     CMP:    Latest Ref Rng & Units 01/14/2023    1:54 PM 10/11/2022    2:25 PM 06/26/2022   11:09 AM  CMP  Glucose 70 - 99 mg/dL   90   BUN 6 - 23 mg/dL   23   Creatinine 0.45 - 1.50 mg/dL    4.09   Sodium 811 - 145 mEq/L   140  Potassium 3.5 - 5.1 mEq/L   4.7   Chloride 96 - 112 mEq/L   105   CO2 19 - 32 mEq/L   29   Calcium 8.4 - 10.5 mg/dL   9.5   Total Protein 6.0 - 8.3 g/dL 6.2  6.3  6.6   Total Bilirubin 0.2 - 1.2 mg/dL 0.5  0.5  0.5   Alkaline Phos 39 - 117 U/L 114  110  111   AST 0 - 37 U/L 19  20  20    ALT 0 - 53 U/L 22  27  21         Edman Circle, MD 01/15/2024, 11:46 AM  Cc: Olive Bass, MD

## 2024-01-15 NOTE — Patient Instructions (Addendum)
 _______________________________________________________  If your blood pressure at your visit was 140/90 or greater, please contact your primary care physician to follow up on this.  _______________________________________________________  If you are age 79 or older, your body mass index should be between 23-30. Your Body mass index is 28.24 kg/m. If this is out of the aforementioned range listed, please consider follow up with your Primary Care Provider.  If you are age 39 or younger, your body mass index should be between 19-25. Your Body mass index is 28.24 kg/m. If this is out of the aformentioned range listed, please consider follow up with your Primary Care Provider.   ________________________________________________________  The [facility] providers would like to encourage you to use [facility] to communicate with providers for non-urgent requests or questions.  Due to long hold times on the telephone, sending your provider a message by [facility] may be a faster and more efficient way to get a response.  Please allow 48 business hours for a response.  Please remember that this is for non-urgent requests.  _______________________________________________________  MRI ordered at [facility]. They will call to schedule. If you haven't heard from them in a week please call them Address: [address], [place], [place] [address]  Phone: [phone]  Please follow up in 6 months. Give [place] a call at [phone] to schedule an appointment.  Thank you,  Dr. Lynann Bologna

## 2024-02-05 DIAGNOSIS — R490 Dysphonia: Secondary | ICD-10-CM

## 2024-02-05 HISTORY — DX: Dysphonia: R49.0

## 2024-02-06 ENCOUNTER — Ambulatory Visit (INDEPENDENT_AMBULATORY_CARE_PROVIDER_SITE_OTHER)
Admission: RE | Admit: 2024-02-06 | Discharge: 2024-02-06 | Disposition: A | Discharge status: Home / Self Care | Source: Ambulatory Visit | Attending: Gastroenterology | Admitting: Gastroenterology

## 2024-02-06 DIAGNOSIS — R748 Abnormal levels of other serum enzymes: Secondary | ICD-10-CM | POA: Diagnosis not present

## 2024-02-06 DIAGNOSIS — R9389 Abnormal findings on diagnostic imaging of other specified body structures: Secondary | ICD-10-CM

## 2024-02-06 MED ORDER — GADOBUTROL 1 MMOL/ML IV SOLN
7.4000 mL | Freq: Once | INTRAVENOUS | Status: AC | PRN
  Administered 2024-02-06: 7.4 mL via INTRAVENOUS

## 2024-02-12 ENCOUNTER — Other Ambulatory Visit (HOSPITAL_COMMUNITY): Payer: Self-pay | Admitting: Urology

## 2024-02-12 DIAGNOSIS — C61 Malignant neoplasm of prostate: Secondary | ICD-10-CM

## 2024-02-12 DIAGNOSIS — N138 Other obstructive and reflux uropathy: Secondary | ICD-10-CM

## 2024-02-14 ENCOUNTER — Telehealth: Payer: Self-pay | Admitting: Gastroenterology

## 2024-02-14 DIAGNOSIS — R718 Other abnormality of red blood cells: Secondary | ICD-10-CM

## 2024-02-14 DIAGNOSIS — R748 Abnormal levels of other serum enzymes: Secondary | ICD-10-CM

## 2024-02-14 DIAGNOSIS — R9389 Abnormal findings on diagnostic imaging of other specified body structures: Secondary | ICD-10-CM

## 2024-02-14 NOTE — Telephone Encounter (Signed)
 Patient advised that Dr. Chales Abrahams is out of the office today & will return next week. He's calling for the results for MRCP 02/06/24.

## 2024-02-14 NOTE — Telephone Encounter (Signed)
 PT has received his results via mychart and would like to have Dr. Urban Gibson recommendations. Please advise.

## 2024-02-18 NOTE — Addendum Note (Signed)
 Addended by: Alberteen Sam E on: 02/18/2024 02:50 PM   Modules accepted: Orders

## 2024-02-20 ENCOUNTER — Encounter (HOSPITAL_COMMUNITY)
Admission: RE | Admit: 2024-02-20 | Discharge: 2024-02-20 | Disposition: A | Discharge status: Home / Self Care | Source: Ambulatory Visit | Attending: Urology | Admitting: Urology

## 2024-02-20 DIAGNOSIS — C61 Malignant neoplasm of prostate: Secondary | ICD-10-CM | POA: Diagnosis present

## 2024-02-20 DIAGNOSIS — N138 Other obstructive and reflux uropathy: Secondary | ICD-10-CM

## 2024-02-20 DIAGNOSIS — N403 Nodular prostate with lower urinary tract symptoms: Secondary | ICD-10-CM | POA: Insufficient documentation

## 2024-02-20 MED ORDER — FLOTUFOLASTAT F 18 GALLIUM 296-5846 MBQ/ML IV SOLN
8.3000 | Freq: Once | INTRAVENOUS | Status: AC
  Administered 2024-02-20: 8.3 via INTRAVENOUS

## 2024-03-06 ENCOUNTER — Other Ambulatory Visit: Payer: Self-pay | Admitting: Cardiology

## 2024-03-09 DIAGNOSIS — N23 Unspecified renal colic: Secondary | ICD-10-CM | POA: Insufficient documentation

## 2024-03-09 HISTORY — DX: Unspecified renal colic: N23

## 2024-03-26 ENCOUNTER — Telehealth: Payer: Self-pay

## 2024-03-26 NOTE — Telephone Encounter (Signed)
 I called pt to introduce myself as  the Coordinator of the Prostate [facility].   1. I confirmed with the patient he is aware of his referral to the clinic 5/27, arriving @ 12:30 pm.    2. I discussed the format of the clinic and the physicians he will be seeing that day.   3. I discussed where the clinic is located and how to contact me.   4. I confirmed his address and informed him I would be mailing a packet of information and forms to be completed. I asked him to bring them with him the day of his appointment.    He voiced understanding of the above. I asked him to call me if he has any questions or concerns regarding his appointments or the forms he needs to complete.

## 2024-04-02 NOTE — Progress Notes (Signed)
 RN left message for patient with my direct number for any questions or concerns for upcoming PMDC on 5/27.

## 2024-04-03 NOTE — Progress Notes (Unsigned)
 [facility] CONSULT NOTE  Patient Care Team: Madlyn Schirmer, MD as PCP - General (Family Medicine) Katheleen Palmer, RN as Oncology Nurse Navigator  ASSESSMENT & PLAN:  Brad Hamilton is a 78 y.o.male with history of nephrolithiasis, HTN, HLD, sleep apnea being seen at [facility] for prostate cancer. Patient has seen Dr. Inga Manges at Urology for elevated PSA and follow up with prostate biopsy.  Initial diagnosis: cT2bN0 vs N1 M0 GG3 4+3=7. iPSA 4.5 Treatment: radiation with ADT  His case was discussed at tumor board with multiple specialists including radiation oncologist, urology oncologist, pathologist, radiologist. Biopsy showed GG3 4+3=7 in 6 right sided cores. PSMA PET showed large volume right prostatic primary. No evidence of tracer avid nodal or osseous metastasis. Discussion from Tumor board felt uptake in the presacral area maybe a node.   Treatment is recommended. Given the possible lymph node involvement radiation with ADT was recommended. We discussed phase III trial from [place] showed survival advantage of adding AAP to radiation and ADT including node positive patients. However with recent report from [facility] showed less overall survival benefit of aibraterone in patients over 75, and the higher rate of Grade 3-5 toxicities, CV events from [place] trial, the risk of aibraterone may outweigh the benefits.  He is still working and would like to preserve his quality of life. In this case, it is reasonable to not add abiraterone given the likelihood of side effects and diminishing benefits from OS with increasing age.   Patient will follow-up with radiation oncology for definitive treatment and surveillance post-treatment with urologic oncology  He may follow-up with medical oncology as needed.  Supportive Healthy lifestyle to prevent diabetes and CV disease Weight-bearing exercises daily as able Limit alcohol consumption and avoid smoking  All questions were answered. The  patient knows to call the clinic with any problems, questions or concerns. No barriers to learning was detected.  Lowanda Ruddy, MD 5/27/20253:11 PM  CHIEF COMPLAINTS/PURPOSE OF CONSULTATION:  prostate cancer  HISTORY OF PRESENTING ILLNESS:  Brad Hamilton 78 y.o. male is here because of prostate cancer. He has rising PSA over the past several years with most recent PSA of 4.5 in 11/2023 from 3.63 from 08/2023. He has follow up biopsy as below.  Oncology History  Cancer [facility])  2005 Initial Diagnosis   Cancer (HCC)   01/31/2024 Pathology Results   Prostate biopsy: GG3 4+3=7 in 6 right sided cores.   02/20/2024 PET scan   PSMA PET 1. Large volume, markedly tracer avid right prostatic primary. 2. No evidence of tracer avid nodal or osseous metastasis.   Malignant neoplasm of prostate (HCC)  01/31/2024 Cancer Staging   Staging form: Prostate, AJCC 8th Edition - Clinical stage from 01/31/2024: Stage IIC (cT2a, cN0, cM0, PSA: 4.5, Grade Group: 3) - Signed by Keitha Pata, PA-C on 04/07/2024 Histopathologic type: Adenocarcinoma, NOS Stage prefix: Initial diagnosis Prostate specific antigen (PSA) range: Less than 10 Gleason primary pattern: 4 Gleason secondary pattern: 3 Gleason score: 7 Histologic grading system: 5 grade system Number of biopsy cores examined: 12 Number of biopsy cores positive: 6 Location of positive needle core biopsies: One side   04/07/2024 Initial Diagnosis   Malignant neoplasm of prostate [facility])     MEDICAL HISTORY:  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  Benign hypertension 03/05/2016   Cancer (HCC) 2005   skin cancer on abdomen   Complication of anesthesia    hyper coming out of anesthesia 1987 cystoscopy   Coronary artery calcification seen on CT scan  04/17/2016   Cough    occasional cough with allergies   Essential hypertension 04/17/2016   Foot drop, right    Gastritis 07/18/2017   GERD (gastroesophageal reflux disease)    Hearing loss 07/18/2017   Hyperlipidemia 07/18/2017   Kidney stones 4 yrs ago   passed on own   Screening for diabetes mellitus (DM) 04/19/2016   Skin cancer 07/18/2017   Sleep apnea 07/18/2017    SURGICAL HISTORY: Past Surgical History:  Procedure Laterality Date   APPENDECTOMY  11/12/1956   CARDIAC CATHETERIZATION  11/12/1996   CERVICAL LAMINECTOMY  11/13/1975   COLONOSCOPY  06/25/2019   Dr Misenheimer. Diminutive rectal polyp (1) Diverticular disease left colon.   COLONOSCOPY  05/20/2014   Dr Monico Anna. Dimunutive colon polyps (3) Diverticular disease left colon   CYSTOSCOPY  11/12/1985   LAMINECTOMY  11/12/2009   LUMBAR LAMINECTOMY/DECOMPRESSION MICRODISCECTOMY  04/03/2012   Procedure: LUMBAR LAMINECTOMY/DECOMPRESSION MICRODISCECTOMY 1 LEVEL;  Surgeon: Verlinda Gloss, MD;  Location: [facility];  Service: Orthopedics;  Laterality: Right;  Microdiscectomy L4 - L5 on the Right (X-Ray)    sinus surgery x 2 1980's      SOCIAL HISTORY: Social History   Socioeconomic History   Marital status: Single    Spouse name: Not on file   Number of children: 4   Years of education: Not on file   Highest education level: Not on file  Occupational History   Occupation: lawyer  Tobacco Use   Smoking status: Former    Current packs/day: 0.00    Average packs/day: 2.0 packs/day for 30.0 years (60.0 ttl pk-yrs)    Types: Cigarettes    Start date: 07/12/1970    Quit date: 07/12/2000    Years since quitting: 23.7    Passive exposure: Past   Smokeless tobacco: Never  Vaping Use   Vaping status: Never Used  Substance and Sexual Activity   Alcohol use: Yes    Alcohol/week: 2.0 standard drinks of alcohol    Types: 1 Glasses of wine, 1 Cans of beer per week    Comment: 1 beer or wine per day   Drug use: Never   Sexual  activity: Yes  Other Topics Concern   Not on file  Social History Narrative   Not on file   Social Drivers of Health   Financial Resource Strain: Not on file  Food Insecurity: No Food Insecurity (04/07/2024)   Hunger Vital Sign    Worried About Running Out of Food in the Last Year: Never true    Ran Out of Food in the Last Year: Never true  Transportation Needs: No Transportation Needs (04/07/2024)   PRAPARE - Administrator, Civil Service (Medical): No    Lack of Transportation (Non-Medical): No  Physical Activity: Not on file  Stress: Not on file  Social Connections: Not on file  Intimate Partner Violence: Not At Risk (04/07/2024)   Humiliation, Afraid, Rape, and Kick questionnaire    Fear of Current or Ex-Partner: No    Emotionally Abused: No    Physically Abused: No    Sexually Abused: No    FAMILY HISTORY: Family History  Problem Relation Age of Onset   Hypertension Mother    Diabetes Mother    Cervical cancer Mother  Bleeding Disorder Mother    Heart disease Mother    Hypertension Father    Diabetes Father    Heart failure Father    Prostate cancer Father    Hypertension Sister    Heart attack Sister    Cancer Maternal Grandmother    Multiple myeloma Maternal Grandfather    Colon cancer Neg Hx    Stomach cancer Neg Hx    Esophageal cancer Neg Hx    Colon polyps Neg Hx     ALLERGIES:  is allergic to statins and zanaflex [tizanidine].  MEDICATIONS:  Current Outpatient Medications  Medication Sig Dispense Refill   aspirin EC 81 MG tablet Take 81 mg by mouth daily with breakfast.     buPROPion (WELLBUTRIN XL) 150 MG 24 hr tablet Take 150 mg by mouth daily with breakfast.     Ca Carbonate-Mag Hydroxide 550-110 MG CHEW Chew 1-2 tablets by mouth as needed.     calcium  carbonate-magnesium hydroxide (ROLAIDS) 334 MG CHEW Chew 1 tablet by mouth as needed (Heartburn).     CIALIS 20 MG tablet Take 20 mg by mouth daily as needed for erectile dysfunction.      cloNIDine (CATAPRES) 0.1 MG tablet Take 0.1 mg by mouth daily as needed (Elevated BP).     diltiazem  (CARDIZEM  CD) 120 MG 24 hr capsule Take 1 capsule by mouth twice daily. 180 capsule 3   ezetimibe  (ZETIA ) 10 MG tablet Take 0.5 tablets (5 mg total) by mouth daily. 15 tablet 0   fluocinonide cream (LIDEX) 0.05 % Apply topically 2 (two) times daily.     ibuprofen (ADVIL,MOTRIN) 200 MG tablet Take 400-600 mg by mouth every 6 (six) hours as needed. For pain     minoxidil (ROGAINE) 2 % external solution Apply 1 Application topically daily.     Multiple Vitamins-Minerals (PRESERVISION AREDS 2+MULTI VIT) CAPS Take 1 tablet by mouth 2 (two) times daily.     olmesartan (BENICAR) 20 MG tablet TAKE 1 TABLET BY MOUTH ONCE DAILY. **DO NOT TAKE IF BP IS LESS THAN 120) 30 tablet 0   OVER THE COUNTER MEDICATION Take by mouth daily with breakfast. Alphaloproic Acid=For immune system     Pitavastatin  Calcium  (LIVALO ) 2 MG TABS Take 1/2 tablet by mouth twice a week as directed. 12 tablet 1   Saw Palmetto 500 MG CAPS Take 500 mg by mouth daily with breakfast.     sildenafil (VIAGRA) 100 MG tablet Take by mouth.     tamsulosin (FLOMAX) 0.4 MG CAPS capsule Take 0.4 mg by mouth daily.     tretinoin (RETIN-A) 0.05 % cream Apply 1 Application topically at bedtime.     vitamin C  (ASCORBIC ACID ) 500 MG tablet Take 500 mg by mouth daily with breakfast.     No current facility-administered medications for this visit.    REVIEW OF SYSTEMS:   All relevant systems were reviewed with the patient and are negative.  PHYSICAL EXAMINATION: ECOG PERFORMANCE STATUS: 1 - Symptomatic but completely ambulatory  VSS  GENERAL: alert, no distress and comfortable  LABORATORY & PATHOLOGY DATA:  I have reviewed the results of labs, PSA and biopsy results related to his cancer.  RADIOGRAPHIC STUDIES: I have reviewed the radiological images related to his cancer during tumor board meeting.

## 2024-04-06 NOTE — Progress Notes (Signed)
 Radiation Oncology         [phone] ________________________________  Multidisciplinary Prostate Cancer Clinic  Initial Radiation Oncology Consultation  Name: Brad Hamilton MRN: [id number]  Date: 04/07/2024  DOB: 1946-05-01  MV:HQION, Ardeth Beckers, MD  [name], MD   REFERRING PHYSICIAN: [name], MD  DIAGNOSIS: 78 y.o. 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  HISTORY OF PRESENT ILLNESS::Brad Hamilton is a 78 y.o. gentleman. He was initially referred to Dr. 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 His PSA began to rise in 2024, reaching 3.63 in 08/2023 and 4.5 in 11/2023. Digital rectal examination performed at follow up on 12/11/23 showed rubbery left lobe and larger, hard right lobe with 3 cm nodule.  The patient proceeded to transrectal ultrasound with 12 biopsies of the prostate on 01/31/24.  The prostate volume measured 26 cc.  Out of 12 core biopsies, 6 were positive, all right-sided cores.  The maximum Gleason score was 4+3, and this was seen in all 6 right-sided samples.  He underwent staging PSMA PET scan on 02/20/24 showing no evidence of disease outside of the prostate.  The patient reviewed the biopsy results with his urologist and he has kindly been referred today to the multidisciplinary prostate cancer clinic for presentation of pathology and radiology studies in our conference for discussion of potential radiation treatment options and clinical evaluation.    ***.  PREVIOUS RADIATION THERAPY: No  PAST MEDICAL HISTORY:  has a past medical history of Acute laryngotracheitis (03/20/2017), Adrenal nodule (HCC) (07/18/2017), Arthralgia of right temporomandibular joint (06/28/2017), Attention deficit (03/20/2017), Benign hypertension (03/05/2016), Cancer (HCC) (2005), Complication of anesthesia, Coronary artery calcification seen on CT  scan (04/17/2016), Cough, Essential hypertension (04/17/2016), Foot drop, right, Gastritis (07/18/2017), GERD (gastroesophageal reflux disease), Hearing loss (07/18/2017), Hyperlipidemia (07/18/2017), Kidney stones (4 yrs ago), Screening for diabetes mellitus (DM) (04/19/2016), Skin cancer (07/18/2017), and Sleep apnea (07/18/2017).    PAST SURGICAL HISTORY: Past Surgical History:  Procedure Laterality Date   APPENDECTOMY  11/12/1956   CARDIAC CATHETERIZATION  11/12/1996   CERVICAL LAMINECTOMY  11/13/1975   COLONOSCOPY  06/25/2019   Dr Misenheimer. Diminutive rectal polyp (1) Diverticular disease left colon.   COLONOSCOPY  05/20/2014   Dr Monico Anna. Dimunutive colon polyps (3) Diverticular disease left colon   CYSTOSCOPY  11/12/1985   LAMINECTOMY  11/12/2009   LUMBAR LAMINECTOMY/DECOMPRESSION MICRODISCECTOMY  04/03/2012   Procedure: LUMBAR LAMINECTOMY/DECOMPRESSION MICRODISCECTOMY 1 LEVEL;  Surgeon: Verlinda Gloss, MD;  Location: [facility];  Service: Orthopedics;  Laterality: Right;  Microdiscectomy L4 - L5 on the Right (X-Ray)    sinus surgery x 2 1980's      FAMILY HISTORY: family history includes Bleeding Disorder in his mother; Cancer in his maternal grandmother; Cervical cancer in his mother; Diabetes in his father and mother; Heart attack in his sister; Heart disease in his mother; Heart failure in his father; Hypertension in his father, mother, and sister; Prostate cancer in his father; Stroke in his maternal grandfather.  SOCIAL HISTORY:  reports that he quit smoking about 23 years ago. His smoking use included cigarettes. He started smoking about 53 years ago. He has a 60 pack-year smoking history. He has been exposed to tobacco smoke. He has never used smokeless tobacco. He reports current alcohol use of about 2.0 standard drinks of alcohol per week. He reports that he does not use  drugs.  ALLERGIES: Statins and Zanaflex [tizanidine]  MEDICATIONS:  Current Outpatient Medications  Medication  Sig Dispense Refill   ANDROGEL PUMP 20.25 MG/ACT (1.62%) GEL Apply 2 application topically 2 (two) times daily.     aspirin EC 81 MG tablet Take 81 mg by mouth daily with breakfast.     buPROPion (WELLBUTRIN XL) 150 MG 24 hr tablet Take 150 mg by mouth daily with breakfast.     Ca Carbonate-Mag Hydroxide 550-110 MG CHEW Chew 1-2 tablets by mouth as needed.     calcium  carbonate-magnesium hydroxide (ROLAIDS) 334 MG CHEW Chew 1 tablet by mouth as needed (Heartburn).     CIALIS 20 MG tablet Take 20 mg by mouth daily as needed for erectile dysfunction.     cloNIDine (CATAPRES) 0.1 MG tablet Take 0.1 mg by mouth daily as needed (Elevated BP).     diltiazem  (CARDIZEM  CD) 120 MG 24 hr capsule Take 1 capsule by mouth twice daily. 180 capsule 3   ezetimibe  (ZETIA ) 10 MG tablet Take 0.5 tablets (5 mg total) by mouth daily. 15 tablet 0   ibuprofen (ADVIL,MOTRIN) 200 MG tablet Take 400-600 mg by mouth every 6 (six) hours as needed. For pain     minoxidil (ROGAINE) 2 % external solution Apply 1 Application topically daily.     Multiple Vitamins-Minerals (PRESERVISION AREDS 2+MULTI VIT) CAPS Take 1 tablet by mouth 2 (two) times daily.     olmesartan (BENICAR) 20 MG tablet TAKE 1 TABLET BY MOUTH ONCE DAILY. **DO NOT TAKE IF BP IS LESS THAN 120) 30 tablet 0   OVER THE COUNTER MEDICATION Take by mouth daily with breakfast. Alphaloproic Acid=For immune system     Pitavastatin  Calcium  (LIVALO ) 2 MG TABS Take 1/2 tablet by mouth twice a week as directed. 12 tablet 1   Saw Palmetto 500 MG CAPS Take 500 mg by mouth daily with breakfast.     sildenafil (VIAGRA) 100 MG tablet Take by mouth.     tretinoin (RETIN-A) 0.05 % cream Apply 1 Application topically at bedtime.     vitamin C  (ASCORBIC ACID ) 500 MG tablet Take 500 mg by mouth daily with breakfast.     No current facility-administered medications for this encounter.    REVIEW OF SYSTEMS:  On review of systems, the patient reports that he is doing well overall.  He denies any chest pain, shortness of breath, cough, fevers, chills, night sweats, unintended weight changes. He denies any bowel disturbances, and denies abdominal pain, nausea or vomiting. He denies any new musculoskeletal or joint aches or pains. His IPSS was ***, indicating *** urinary symptoms. His SHIM was ***, indicating he {does not have/has mild/moderate/severe} erectile dysfunction. A complete review of systems is obtained and is otherwise negative.   PHYSICAL EXAM:  Wt Readings from Last 3 Encounters:  01/15/24 164 lb 8 oz (74.6 kg)  03/13/23 164 lb (74.4 kg)  06/26/22 163 lb (73.9 kg)   Temp Readings from Last 3 Encounters:  01/04/20 97.7 F (36.5 C)  04/04/12 99.1 F (37.3 C) (Oral)  04/01/12 97.7 F (36.5 C) (Oral)   BP Readings from Last 3 Encounters:  01/15/24 124/60  03/13/23 120/80  06/26/22 122/78   Pulse Readings from Last 3 Encounters:  01/15/24 68  03/13/23 60  06/26/22 (!) 58    /10  In general this is a well appearing *** man in no acute distress. He's alert and oriented x4 and appropriate throughout the examination. Cardiopulmonary assessment is negative for acute distress  and he exhibits normal effort.    KPS = ***  100 - Normal; no complaints; no evidence of disease. 90   - Able to carry on normal activity; minor signs or symptoms of disease. 80   - Normal activity with effort; some signs or symptoms of disease. 65   - Cares for self; unable to carry on normal activity or to do active work. 60   - Requires occasional assistance, but is able to care for most of his personal needs. 50   - Requires considerable assistance and frequent medical care. 40   - Disabled; requires special care and assistance. 30   - Severely disabled; hospital admission is indicated although death not imminent. 20   - Very sick; hospital admission necessary; active supportive treatment necessary. 10   - Moribund; fatal processes progressing rapidly. 0     -  Dead  Karnofsky DA, Abelmann WH, Craver LS and Burchenal [facility] [phone]) The use of the nitrogen mustards in the palliative treatment of carcinoma: with particular reference to bronchogenic carcinoma Cancer 1 [phone]   LABORATORY DATA:  Lab Results  Component Value Date   WBC 6.9 06/26/2022   HGB 13.6 06/26/2022   HCT 41.6 06/26/2022   MCV 100.1 (H) 06/26/2022   PLT 216.0 06/26/2022   Lab Results  Component Value Date   NA 140 06/26/2022   K 4.7 06/26/2022   CL 105 06/26/2022   CO2 29 06/26/2022   Lab Results  Component Value Date   ALT 22 01/14/2023   AST 19 01/14/2023   ALKPHOS 114 01/14/2023   BILITOT 0.5 01/14/2023     RADIOGRAPHY: No results found.    IMPRESSION/PLAN: 78 y.o. gentleman with Stage T2c adenocarcinoma of the prostate with a Gleason score of 4+3 and a PSA of 4.5.    We discussed the patient's workup and outlined the nature of prostate cancer in this setting. The patient's T stage, Gleason's score, and PSA put him into the intermediate risk group. Accordingly, he is eligible for a variety of potential treatment options including {ADT concurrent with} brachytherapy, 5.5 weeks of external radiation, or prostatectomy. We discussed the available radiation techniques, and focused on the details and logistics of delivery. We discussed and outlined the risks, benefits, short and long-term effects associated with radiotherapy and compared and contrasted these with prostatectomy. We discussed the role of SpaceOAR gel in reducing the rectal toxicity associated with radiotherapy. {We also detailed the role of ADT in the treatment of *** risk prostate cancer and outlined the associated side effects that could be expected with this therapy.} He appears to have a good understanding of his disease and our treatment recommendations which are of curative intent.  He was encouraged to ask questions that were answered to his/their stated satisfaction.  At the end of the conversation the  patient is interested in moving forward with ***.   We personally spent *** minutes in this encounter including chart review, reviewing radiological studies, meeting face-to-face with the patient, entering orders and completing documentation.    Arta Bihari, PA-C    Kenith Payer, MD  [facility]  Radiation Oncology Direct Dial: [phone]  Fax: [phone] [email or web address]  Skype  [email or web address]   This document serves as a record of services personally performed by Kenith Payer, MD and Keitha Pata, PA-C. It was created on their behalf by Florance Hun, a trained medical scribe. The creation of this record is based on the scribe's personal observations and the provider's statements  to them. This document has been checked and approved by the attending provider.

## 2024-04-07 ENCOUNTER — Ambulatory Visit
Admission: RE | Admit: 2024-04-07 | Discharge: 2024-04-07 | Disposition: A | Discharge status: Home / Self Care | Source: Ambulatory Visit | Attending: Radiation Oncology | Admitting: Radiation Oncology

## 2024-04-07 ENCOUNTER — Telehealth: Payer: Self-pay | Admitting: Genetic Counselor

## 2024-04-07 ENCOUNTER — Inpatient Hospital Stay

## 2024-04-07 ENCOUNTER — Encounter: Payer: Self-pay | Admitting: Genetic Counselor

## 2024-04-07 VITALS — BP 126/75 | HR 62 | Temp 97.8°F | Resp 18 | Ht 64.0 in | Wt 164.5 lb

## 2024-04-07 DIAGNOSIS — Z87442 Personal history of urinary calculi: Secondary | ICD-10-CM | POA: Diagnosis not present

## 2024-04-07 DIAGNOSIS — Z832 Family history of diseases of the blood and blood-forming organs and certain disorders involving the immune mechanism: Secondary | ICD-10-CM

## 2024-04-07 DIAGNOSIS — Z9049 Acquired absence of other specified parts of digestive tract: Secondary | ICD-10-CM

## 2024-04-07 DIAGNOSIS — Z8049 Family history of malignant neoplasm of other genital organs: Secondary | ICD-10-CM

## 2024-04-07 DIAGNOSIS — Z808 Family history of malignant neoplasm of other organs or systems: Secondary | ICD-10-CM | POA: Diagnosis not present

## 2024-04-07 DIAGNOSIS — Z8042 Family history of malignant neoplasm of prostate: Secondary | ICD-10-CM

## 2024-04-07 DIAGNOSIS — C61 Malignant neoplasm of prostate: Secondary | ICD-10-CM

## 2024-04-07 DIAGNOSIS — G4730 Sleep apnea, unspecified: Secondary | ICD-10-CM | POA: Diagnosis not present

## 2024-04-07 DIAGNOSIS — Z860100 Personal history of colon polyps, unspecified: Secondary | ICD-10-CM

## 2024-04-07 DIAGNOSIS — Z7982 Long term (current) use of aspirin: Secondary | ICD-10-CM

## 2024-04-07 DIAGNOSIS — Z809 Family history of malignant neoplasm, unspecified: Secondary | ICD-10-CM

## 2024-04-07 DIAGNOSIS — I10 Essential (primary) hypertension: Secondary | ICD-10-CM | POA: Diagnosis not present

## 2024-04-07 DIAGNOSIS — Z8719 Personal history of other diseases of the digestive system: Secondary | ICD-10-CM | POA: Diagnosis not present

## 2024-04-07 DIAGNOSIS — I2510 Atherosclerotic heart disease of native coronary artery without angina pectoris: Secondary | ICD-10-CM

## 2024-04-07 DIAGNOSIS — Z79899 Other long term (current) drug therapy: Secondary | ICD-10-CM | POA: Diagnosis not present

## 2024-04-07 DIAGNOSIS — E785 Hyperlipidemia, unspecified: Secondary | ICD-10-CM | POA: Diagnosis not present

## 2024-04-07 DIAGNOSIS — Z8249 Family history of ischemic heart disease and other diseases of the circulatory system: Secondary | ICD-10-CM

## 2024-04-07 DIAGNOSIS — Z85828 Personal history of other malignant neoplasm of skin: Secondary | ICD-10-CM

## 2024-04-07 DIAGNOSIS — Z888 Allergy status to other drugs, medicaments and biological substances status: Secondary | ICD-10-CM | POA: Diagnosis not present

## 2024-04-07 DIAGNOSIS — Z833 Family history of diabetes mellitus: Secondary | ICD-10-CM

## 2024-04-07 DIAGNOSIS — Z87891 Personal history of nicotine dependence: Secondary | ICD-10-CM | POA: Diagnosis not present

## 2024-04-07 HISTORY — DX: Malignant neoplasm of prostate: C61

## 2024-04-07 NOTE — Progress Notes (Addendum)
                               Care Plan Summary  Name: Amel Gianino DOB: 08/22/1946   Your Medical Team:   Urologist -  Dr. Florencio Hunting, [facility]  Radiation Oncologist - Dr. Kenith Payer, [facility]   Medical Oncologist - Dr. Janeann Mean, [facility]  Recommendations: 1) Hormonal therapy  2) 8 weeks of daily radiation     * These recommendations are based on information available as of today's consult.      Recommendations may change depending on the results of further tests or exams.    Next Steps: 1) We will communicate to [facility] to set up for your hormonal therapy.  2) We will place referral for Dr. Cathren Coaster for Radiation Consult in [place]    When appointments need to be scheduled, you will be contacted by [facility] and/or [facility].  Questions?  Please do not hesitate to call Katheleen Palmer, BSN, RN at [phone] with any questions or concerns.  Paola Bohr is your Oncology Nurse Navigator and is available to assist you while you're receiving your medical care at [facility].

## 2024-04-07 NOTE — Telephone Encounter (Signed)
 Mr. Hemme was seen by a genetic counselor during the {breast/prostate} multidisciplinary clinic on ***. In addition to his personal history of {breast/prostate} cancer, he reported a personal history of *** cancer and a family history of *** cancer. He does not meet NCCN criteria for genetic testing at this time. He was still offered genetic counseling and testing but declined. We encourage him to contact us  if there are any changes to his personal or family history of cancer. If he meets NCCN criteria based on the updated personal/family history, he would be recommended to have genetic counseling and testing.

## 2024-04-08 ENCOUNTER — Encounter: Payer: Self-pay | Admitting: Genetic Counselor

## 2024-04-08 ENCOUNTER — Telehealth: Payer: Self-pay | Admitting: *Deleted

## 2024-04-08 NOTE — Telephone Encounter (Signed)
 CALLED PATIENT TO INFORM OF ADT APPT. FOR 04-27-24- ARRIVAL TIME- 8:30 AM @ DR. WRENN'S OFFICE @ [facility], LVM FOR A RETURN CALL

## 2024-04-08 NOTE — Telephone Encounter (Signed)
 Called patient to inform that ADT is pill form and that the appt. for 04-27-24 has been cancelled and rescheduled for 05-18-24 - arrival time- 2:45 pm @ Dr. Vida Gravely Office, lvm for a return call

## 2024-04-09 ENCOUNTER — Other Ambulatory Visit: Payer: Self-pay | Admitting: Urology

## 2024-04-09 ENCOUNTER — Other Ambulatory Visit: Payer: Self-pay | Admitting: Cardiology

## 2024-04-14 NOTE — Progress Notes (Unsigned)
 Patient was prescribed Orgovyx by Dr. Inga Manges on 6/2 for initiation of ADT.  RN left message with patient to follow up after recent PMDC.

## 2024-04-15 ENCOUNTER — Other Ambulatory Visit: Payer: Self-pay | Admitting: Cardiology

## 2024-04-17 ENCOUNTER — Encounter: Payer: Self-pay | Admitting: Cardiology

## 2024-04-17 ENCOUNTER — Other Ambulatory Visit: Payer: Self-pay

## 2024-04-17 ENCOUNTER — Ambulatory Visit: Attending: Cardiology | Admitting: Cardiology

## 2024-04-17 ENCOUNTER — Ambulatory Visit: Attending: Cardiology

## 2024-04-17 VITALS — BP 118/80 | HR 59 | Ht 66.0 in | Wt 164.0 lb

## 2024-04-17 DIAGNOSIS — R072 Precordial pain: Secondary | ICD-10-CM

## 2024-04-17 DIAGNOSIS — R0600 Dyspnea, unspecified: Secondary | ICD-10-CM

## 2024-04-17 DIAGNOSIS — C61 Malignant neoplasm of prostate: Secondary | ICD-10-CM

## 2024-04-17 DIAGNOSIS — R002 Palpitations: Secondary | ICD-10-CM

## 2024-04-17 DIAGNOSIS — I10 Essential (primary) hypertension: Secondary | ICD-10-CM

## 2024-04-17 DIAGNOSIS — I2510 Atherosclerotic heart disease of native coronary artery without angina pectoris: Secondary | ICD-10-CM

## 2024-04-17 DIAGNOSIS — I75021 Atheroembolism of right lower extremity: Secondary | ICD-10-CM | POA: Diagnosis not present

## 2024-04-17 DIAGNOSIS — R0609 Other forms of dyspnea: Secondary | ICD-10-CM

## 2024-04-17 NOTE — Progress Notes (Signed)
 Cardiology Office Note:    Date:  04/17/2024   ID:  Brad Hamilton, DOB Jul 24, 1946, MRN [id number]  PCP:  Madlyn Schirmer, MD  Cardiologist:  Ralene Burger, MD    Referring MD: Madlyn Schirmer, MD   Chief Complaint  Patient presents with   Near Syncope   Dizziness    History of Present Illness:    Brad Hamilton is a 78 y.o. male past medical history significant for essential hypertension, hyperlipidemia with intolerance to high intensity statin, mild carotic atherosclerosis.  Requested to be seen because of episode of lightheadedness and dizziness.  Overall he is very active he plays tennis on the regular basis.  For the beginning of the year he was diagnosed with prostate cancer.  Likely and there is no metastasis identified, Gleason scale is 7, radiation therapy is contemplated but not initiated yet.  He said for last couple weeks or so he is experiencing lightheadedness that happen typically when he walks he tried to walk.  He describes a situation when he was playing tennis last Tuesday and almost passed out.  There is no chest pain tightness squeezing pressure burning chest just fatigue tiredness and the sensation of lack of energy.  He also described the fact that his ability to exercise is diminished compared to the way it was before.  He is more fatigued and tired.  He was taking testosterone that but testosterone was not withdrawn when he was fine to have prostate cancer.  He does not smoke, he is on statin, last LDL I have is from Mar 13, 2023 with LDL of 75.  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   Benign hypertension 03/05/2016   Cancer (HCC) 2005   skin cancer on abdomen   Complication of anesthesia    hyper coming out of anesthesia 1987 cystoscopy   Coronary artery  calcification seen on CT scan 04/17/2016   Cough    occasional cough with allergies   Essential hypertension 04/17/2016   Foot drop, right    Gastritis 07/18/2017   GERD (gastroesophageal reflux disease)    Hearing loss 07/18/2017   Hyperlipidemia 07/18/2017   Kidney stones 4 yrs ago   passed on own   Screening for diabetes mellitus (DM) 04/19/2016   Skin cancer 07/18/2017   Sleep apnea 07/18/2017    Past Surgical History:  Procedure Laterality Date   APPENDECTOMY  11/12/1956   CARDIAC CATHETERIZATION  11/12/1996   CERVICAL LAMINECTOMY  11/13/1975   COLONOSCOPY  06/25/2019   Dr Misenheimer. Diminutive rectal polyp (1) Diverticular disease left colon.   COLONOSCOPY  05/20/2014   Dr Monico Anna. Dimunutive colon polyps (3) Diverticular disease left colon   CYSTOSCOPY  11/12/1985   LAMINECTOMY  11/12/2009   LUMBAR LAMINECTOMY/DECOMPRESSION MICRODISCECTOMY  04/03/2012   Procedure: LUMBAR LAMINECTOMY/DECOMPRESSION MICRODISCECTOMY 1 LEVEL;  Surgeon: Verlinda Gloss, MD;  Location: [facility];  Service: Orthopedics;  Laterality: Right;  Microdiscectomy L4 - L5 on the Right (X-Ray)    sinus surgery x 2 1980's      Current Medications: Current Meds  Medication Sig   aspirin EC 81 MG tablet Take 81 mg by mouth daily with breakfast.   buPROPion (WELLBUTRIN XL) 150 MG 24 hr tablet Take 150 mg by mouth daily with breakfast.   Ca Carbonate-Mag  Hydroxide 550-110 MG CHEW Chew 1-2 tablets by mouth daily.   calcium  carbonate-magnesium hydroxide (ROLAIDS) 334 MG CHEW Chew 1 tablet by mouth as needed (Heartburn).   CIALIS 20 MG tablet Take 20 mg by mouth daily as needed for erectile dysfunction.   cloNIDine (CATAPRES) 0.1 MG tablet Take 0.1 mg by mouth daily as needed (Elevated BP).   diltiazem  (CARDIZEM  CD) 120 MG 24 hr capsule TAKE ONE CAPSULE BY MOUTH TWICE DAILY   ezetimibe  (ZETIA ) 10 MG tablet Take 1 tablet (10 mg total) by mouth daily. Patient needs appointment for further refills. 1 st attempt    ibuprofen (ADVIL,MOTRIN) 200 MG tablet Take 400-600 mg by mouth every 6 (six) hours as needed for mild pain (pain score 1-3) or moderate pain (pain score 4-6). For pain   minoxidil (ROGAINE) 2 % external solution Apply 1 Application topically daily.   Multiple Vitamins-Minerals (PRESERVISION AREDS 2+MULTI VIT) CAPS Take 1 tablet by mouth 2 (two) times daily.   OVER THE COUNTER MEDICATION Take 1 tablet by mouth daily with breakfast. Alphaloproic Acid=For immune system   Pitavastatin  Calcium  (LIVALO ) 2 MG TABS Take 1/2 tablet by mouth twice a week as directed. (Patient taking differently: Take 1 tablet by mouth See admin instructions. Take 1/2 tablet by mouth twice a week as directed.)   Saw Palmetto 500 MG CAPS Take 500 mg by mouth daily with breakfast.   sildenafil (VIAGRA) 100 MG tablet Take 50 mg by mouth as needed for erectile dysfunction.   tamsulosin (FLOMAX) 0.4 MG CAPS capsule Take 0.4 mg by mouth daily.   tretinoin (RETIN-A) 0.05 % cream Apply 1 Application topically at bedtime.   [DISCONTINUED] fluocinonide cream (LIDEX) 0.05 % Apply 1 Application topically 2 (two) times daily.   [DISCONTINUED] olmesartan (BENICAR) 20 MG tablet Take 1 tablet (20 mg total) by mouth daily. Patient needs appointment for further refills. 1 st attempt   [DISCONTINUED] vitamin C  (ASCORBIC ACID ) 500 MG tablet Take 500 mg by mouth daily with breakfast.     Allergies:   Statins and Zanaflex [tizanidine]   Social History   Socioeconomic History   Marital status: Single    Spouse name: Not on file   Number of children: 4   Years of education: Not on file   Highest education level: Not on file  Occupational History   Occupation: lawyer  Tobacco Use   Smoking status: Former    Current packs/day: 0.00    Average packs/day: 2.0 packs/day for 30.0 years (60.0 ttl pk-yrs)    Types: Cigarettes    Start date: 07/12/1970    Quit date: 07/12/2000    Years since quitting: 23.7    Passive exposure: Past    Smokeless tobacco: Never  Vaping Use   Vaping status: Never Used  Substance and Sexual Activity   Alcohol use: Yes    Alcohol/week: 2.0 standard drinks of alcohol    Types: 1 Glasses of wine, 1 Cans of beer per week    Comment: 1 beer or wine per day   Drug use: Never   Sexual activity: Yes  Other Topics Concern   Not on file  Social History Narrative   Not on file   Social Drivers of Health   Financial Resource Strain: Not on file  Food Insecurity: No Food Insecurity (04/07/2024)   Hunger Vital Sign    Worried About Running Out of Food in the Last Year: Never true    Ran Out of Food in the Last Year: Never  true  Transportation Needs: No Transportation Needs (04/07/2024)   PRAPARE - Administrator, Civil Service (Medical): No    Lack of Transportation (Non-Medical): No  Physical Activity: Not on file  Stress: Not on file  Social Connections: Not on file     Family History: The patient's family history includes Bleeding Disorder in his mother; Cervical cancer (age of onset: 33) in his mother; Diabetes in his father and mother; Heart attack in his sister; Heart disease in his mother; Heart failure in his father; Hypertension in his father, mother, and sister; Leukemia in his cousin; Multiple myeloma (age of onset: 76) in his maternal uncle; Multiple myeloma (age of onset: 30) in his maternal grandmother; Prostate cancer (age of onset: 2 - 77) in his father. There is no history of Colon cancer, Stomach cancer, Esophageal cancer, or Colon polyps. ROS:   Please see the history of present illness.    All 14 point review of systems negative except as described per history of present illness  EKGs/Labs/Other Studies Reviewed:    EKG Interpretation Date/Time:  Friday April 17 2024 15:35:23 EDT Ventricular Rate:  59 PR Interval:  160 QRS Duration:  80 QT Interval:  418 QTC Calculation: 413 R Axis:   -21  Text Interpretation: Sinus bradycardia Otherwise normal ECG When  compared with ECG of 01-Apr-2012 14:59, No significant change was found Confirmed by Ralene Burger [phone]) on 04/17/2024 3:39:22 PM    Recent Labs: No results found for requested labs within last 365 days.  Recent Lipid Panel    Component Value Date/Time   CHOL 157 03/13/2023 1502   TRIG 189 (H) 03/13/2023 1502   HDL 50 03/13/2023 1502   CHOLHDL 3.1 03/13/2023 1502   LDLCALC 75 03/13/2023 1502    Physical Exam:    VS:  BP 118/80 (BP Location: Right Arm, Patient Position: Sitting)   Pulse (!) 59   Ht 5\' 6"  (1.676 m)   Wt 164 lb (74.4 kg)   SpO2 94%   BMI 26.47 kg/m     Wt Readings from Last 3 Encounters:  04/17/24 164 lb (74.4 kg)  04/07/24 164 lb 8 oz (74.6 kg)  01/15/24 164 lb 8 oz (74.6 kg)     GEN:  Well nourished, well developed in no acute distress HEENT: Normal NECK: No JVD; No carotid bruits LYMPHATICS: No lymphadenopathy CARDIAC: RRR, no murmurs, no rubs, no gallops RESPIRATORY:  Clear to auscultation without rales, wheezing or rhonchi  ABDOMEN: Soft, non-tender, non-distended MUSCULOSKELETAL:  No edema; No deformity  SKIN: Warm and dry LOWER EXTREMITIES: no swelling NEUROLOGIC:  Alert and oriented x 3 PSYCHIATRIC:  Normal affect   ASSESSMENT:    1. Essential hypertension   2. 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 Benign hypertension   5. Malignant neoplasm of prostate (HCC)    PLAN:    In order of problems listed above:  Lightheadedness.  Will put Zio patch on him for 2 weeks to see if he get any significant arrhythmia.  I also asked him to check a blood pressure on the regular basis maybe twice or 3 times a day different days different time of the day and monitor and document that.  As a part of her evaluation echocardiogram to be done to assess left ventricular ejection fraction this is mostly for dyspnea on exertion and fatigue. Calcification of the coronary arteries, we will  schedule  him to have coronary CT angio he does not have typical exertional chest pain but he does have exertional shortness of breath plus lightheadedness dizziness on top of that he was found to have calcification of the coronary arteries.  He did have cardiac catheterization more than 26 years ago which show no significant obstruction. Essential hypertension blood pressure seems to be slightly on the lower side but he said when he checked it at home it usually 130-140 systolic will continue monitoring he may require adjustment of his medications. His symptomatology is concerning but not necessarily specific.  Will do cardiac workup as described above, however if that is negative we may reach conclusion that his symptomatology of weakness fatigue can be related to prostate cancer both physically and psychologically, also the fact that his testosterone has been withdrawn.   Medication Adjustments/Labs and Tests Ordered: Current medicines are reviewed at length with the patient today.  Concerns regarding medicines are outlined above.  Orders Placed This Encounter  Procedures   EKG 12-Lead   Medication changes: No orders of the defined types were placed in this encounter.   Signed, Manfred Seed, MD, [facility] 04/17/2024 4:06 PM    [facility]

## 2024-04-17 NOTE — Patient Instructions (Addendum)
 Medication Instructions:  Your physician recommends that you continue on your current medications as directed. Please refer to the Current Medication list given to you today.  *If you need a refill on your cardiac medications before your next appointment, please call your pharmacy*   Lab Work: None Ordered If you have labs (blood work) drawn today and your tests are completely normal, you will receive your results only by: MyChart Message (if you have MyChart) OR A paper copy in the mail If you have any lab test that is abnormal or we need to change your treatment, we will call you to review the results.   Testing/Procedures:   WHY IS MY DOCTOR PRESCRIBING ZIO? The Zio system is proven and trusted by physicians to detect and diagnose irregular heart rhythms -- and has been prescribed to hundreds of thousands of patients.  The FDA has cleared the Zio system to monitor for many different kinds of irregular heart rhythms. In a study, physicians were able to reach a diagnosis 90% of the time with the Zio system1.  You can wear the Zio monitor -- a small, discreet, comfortable patch -- during your normal day-to-day activity, including while you sleep, shower, and exercise, while it records every single heartbeat for analysis.  1Barrett, P., et al. Comparison of 24 Hour Holter Monitoring Versus 14 Day Novel Adhesive Patch Electrocardiographic Monitoring. American Journal of Medicine, 2014.  ZIO VS. HOLTER MONITORING The Zio monitor can be comfortably worn for up to 14 days. Holter monitors can be worn for 24 to 48 hours, limiting the time to record any irregular heart rhythms you may have. Zio is able to capture data for the 51% of patients who have their first symptom-triggered arrhythmia after 48 hours.1  LIVE WITHOUT RESTRICTIONS The Zio ambulatory cardiac monitor is a small, unobtrusive, and water-resistant patch--you might even forget you're wearing it. The Zio monitor records and stores  every beat of your heart, whether you're sleeping, working out, or showering.    Your physician has requested that you have an echocardiogram. Echocardiography is a painless test that uses sound waves to create images of your heart. It provides your doctor with information about the size and shape of your heart and how well your heart's chambers and valves are working. This procedure takes approximately one hour. There are no restrictions for this procedure. Please do NOT wear cologne, perfume, aftershave, or lotions (deodorant is allowed). Please arrive 15 minutes prior to your appointment time.  Please note: We ask at that you not bring children with you during ultrasound (echo/ vascular) testing. Due to room size and safety concerns, children are not allowed in the ultrasound rooms during exams. Our front office staff cannot provide observation of children in our lobby area while testing is being conducted. An adult accompanying a patient to their appointment will only be allowed in the ultrasound room at the discretion of the ultrasound technician under special circumstances. We apologize for any inconvenience.   Your cardiac CT will be scheduled at one of the below locations:   [facility] [address] [place], [place] [address] [phone]  Please follow these instructions carefully (unless otherwise directed):  Hold all erectile dysfunction medications at least 3 days (72 hrs) prior to test.  On the Night Before the Test: Be sure to Drink plenty of water. Do not consume any caffeinated/decaffeinated beverages or chocolate 12 hours prior to your test. Do not take any antihistamines 12 hours prior to your test.  On the Day of the Test: Drink plenty of water until 1 hour prior to the test. Do not eat any food 4 hours prior to the test. You may take your regular medications prior to the test.        After the Test: Drink plenty of water. After receiving IV contrast, you  may experience a mild flushed feeling. This is normal. On occasion, you may experience a mild rash up to 24 hours after the test. This is not dangerous. If this occurs, you can take Benadryl  25 mg and increase your fluid intake. If you experience trouble breathing, this can be serious. If it is severe call 911 IMMEDIATELY. If it is mild, please call our office. If you take any of these medications: Glipizide/Metformin, Avandament, Glucavance, please do not take 48 hours after completing test unless otherwise instructed.  We will call to schedule your test 2-4 weeks out understanding that some insurance companies will need an authorization prior to the service being performed.   For non-scheduling related questions, please contact the cardiac imaging nurse navigator should you have any questions/concerns: Jinger Mount, Cardiac Imaging Nurse Navigator Chase Copping, Cardiac Imaging Nurse Navigator [facility] Direct Office Dial: [phone]   For scheduling needs, including cancellations and rescheduling, please call [place], [phone].    Follow-Up: At [facility], you and your health needs are our priority.  As part of our continuing mission to provide you with exceptional heart care, we have created designated Provider Care Teams.  These Care Teams include your primary Cardiologist (physician) and Advanced Practice Providers (APPs -  Physician Assistants and Nurse Practitioners) who all work together to provide you with the care you need, when you need it.  We recommend signing up for the patient portal called "MyChart".  Sign up information is provided on this After Visit Summary.  MyChart is used to connect with patients for Virtual Visits (Telemedicine).  Patients are able to view lab/test results, encounter notes, upcoming appointments, etc.  Non-urgent messages can be sent to your provider as well.   To learn more about what you can do with MyChart, go to  [email or web address].    Your next appointment:   6 week(s)  The format for your next appointment:   In Person  Provider:   Ralene Burger, MD    Other Instructions NA

## 2024-04-20 ENCOUNTER — Encounter: Payer: Self-pay | Admitting: Radiation Oncology

## 2024-04-20 ENCOUNTER — Ambulatory Visit
Admission: RE | Admit: 2024-04-20 | Discharge: 2024-04-20 | Disposition: A | Discharge status: Home / Self Care | Source: Ambulatory Visit | Attending: Radiation Oncology | Admitting: Radiation Oncology

## 2024-04-20 VITALS — BP 139/70 | HR 51 | Temp 98.1°F | Resp 16 | Ht 68.0 in | Wt 160.0 lb

## 2024-04-20 DIAGNOSIS — I2510 Atherosclerotic heart disease of native coronary artery without angina pectoris: Secondary | ICD-10-CM | POA: Insufficient documentation

## 2024-04-20 DIAGNOSIS — C61 Malignant neoplasm of prostate: Secondary | ICD-10-CM | POA: Diagnosis present

## 2024-04-20 DIAGNOSIS — Z79899 Other long term (current) drug therapy: Secondary | ICD-10-CM | POA: Diagnosis not present

## 2024-04-20 DIAGNOSIS — E785 Hyperlipidemia, unspecified: Secondary | ICD-10-CM | POA: Diagnosis not present

## 2024-04-20 DIAGNOSIS — I10 Essential (primary) hypertension: Secondary | ICD-10-CM | POA: Diagnosis not present

## 2024-04-20 DIAGNOSIS — Z7982 Long term (current) use of aspirin: Secondary | ICD-10-CM | POA: Insufficient documentation

## 2024-04-20 DIAGNOSIS — Z807 Family history of other malignant neoplasms of lymphoid, hematopoietic and related tissues: Secondary | ICD-10-CM | POA: Diagnosis not present

## 2024-04-20 DIAGNOSIS — Z87891 Personal history of nicotine dependence: Secondary | ICD-10-CM | POA: Insufficient documentation

## 2024-04-20 DIAGNOSIS — G4730 Sleep apnea, unspecified: Secondary | ICD-10-CM | POA: Insufficient documentation

## 2024-04-20 DIAGNOSIS — Z85828 Personal history of other malignant neoplasm of skin: Secondary | ICD-10-CM | POA: Insufficient documentation

## 2024-04-20 DIAGNOSIS — K219 Gastro-esophageal reflux disease without esophagitis: Secondary | ICD-10-CM | POA: Insufficient documentation

## 2024-04-20 DIAGNOSIS — Z87442 Personal history of urinary calculi: Secondary | ICD-10-CM | POA: Insufficient documentation

## 2024-04-20 HISTORY — DX: Malignant neoplasm of prostate: C61

## 2024-04-21 NOTE — Progress Notes (Signed)
 Patient met with Dr. Cathren Coaster for a radiation oncology consult on 6/9.

## 2024-04-24 NOTE — Progress Notes (Signed)
 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 male seen today regarding his recent diagnosis of Gleason 7 adenocarcinoma of the prostate.  He has been previously seen by Dr. Bartholome Hamilton in [place], as well as by Dr. Inga Hamilton.  Treatment with external beam radiation, androgen deprivation, has been recommended.  As the patient lives in [place], he asked that his radiation be performed at our facility.    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   Benign hypertension 03/05/2016   Cancer (HCC) 2005   skin cancer on abdomen   Complication of anesthesia    hyper coming out of anesthesia 1987 cystoscopy   Coronary artery calcification seen on CT scan 04/17/2016   Cough    occasional cough with allergies   Essential hypertension 04/17/2016   Foot drop, right    Gastritis 07/18/2017   GERD (gastroesophageal reflux disease)    Hearing loss 07/18/2017   Hyperlipidemia 07/18/2017   Kidney stones 4 yrs ago   passed on own   Prostate cancer [facility])    Prostate cancer (HCC)    Screening for diabetes mellitus (DM) 04/19/2016   Skin cancer 07/18/2017   Sleep apnea 07/18/2017       PAST SURGICAL HISTORY: Past Surgical History:  Procedure Laterality Date   APPENDECTOMY  11/12/1956   CARDIAC CATHETERIZATION  11/12/1996   CERVICAL LAMINECTOMY  11/13/1975   COLONOSCOPY   06/25/2019   Dr Misenheimer. Diminutive rectal polyp (1) Diverticular disease left colon.   COLONOSCOPY  05/20/2014   Dr Monico Anna. Dimunutive colon polyps (3) Diverticular disease left colon   CYSTOSCOPY  11/12/1985   LAMINECTOMY  11/12/2009   LUMBAR LAMINECTOMY/DECOMPRESSION MICRODISCECTOMY  04/03/2012   Procedure: LUMBAR LAMINECTOMY/DECOMPRESSION MICRODISCECTOMY 1 LEVEL;  Surgeon: Verlinda Gloss, MD;  Location: [facility];  Service: Orthopedics;  Laterality: Right;  Microdiscectomy L4 - L5 on the Right (X-Ray)    sinus surgery x 2 1980's       FAMILY HISTORY:  Family History  Problem Relation Age of Onset   Hypertension Mother    Diabetes Mother    Cervical cancer Mother 86   Bleeding Disorder Mother    Heart disease Mother    Hypertension Father    Diabetes Father    Heart failure Father    Prostate cancer Father 59 - 54   Hypertension Sister    Heart attack Sister    Multiple myeloma Maternal Grandmother 29   Multiple myeloma Maternal Uncle 3   Leukemia Cousin        paternal first cousin   Colon cancer Neg Hx    Stomach cancer Neg Hx    Esophageal cancer Neg Hx    Colon polyps Neg Hx      SOCIAL HISTORY:  reports that he quit smoking about 23 years ago. His smoking use included cigarettes. He started  smoking about 53 years ago. He has a 60 pack-year smoking history. He has been exposed to tobacco smoke. He has never used smokeless tobacco. He reports current alcohol use of about 2.0 standard drinks of alcohol per week. He reports that he does not use drugs.   ALLERGIES: Statins and Zanaflex [tizanidine]   MEDICATIONS:  Current Outpatient Medications  Medication Sig Dispense Refill   aspirin EC 81 MG tablet Take 81 mg by mouth daily with breakfast.     buPROPion (WELLBUTRIN XL) 150 MG 24 hr tablet Take 150 mg by mouth daily with breakfast.     Ca Carbonate-Mag Hydroxide 550-110 MG CHEW Chew 1-2 tablets by mouth daily.     calcium  carbonate-magnesium hydroxide  (ROLAIDS) 334 MG CHEW Chew 1 tablet by mouth as needed (Heartburn).     CIALIS 20 MG tablet Take 20 mg by mouth daily as needed for erectile dysfunction.     cloNIDine (CATAPRES) 0.1 MG tablet Take 0.1 mg by mouth daily as needed (Elevated BP).     diltiazem  (CARDIZEM  CD) 120 MG 24 hr capsule TAKE ONE CAPSULE BY MOUTH TWICE DAILY 60 capsule 0   ezetimibe  (ZETIA ) 10 MG tablet Take 1 tablet (10 mg total) by mouth daily. Patient needs appointment for further refills. 1 st attempt 30 tablet 0   ibuprofen (ADVIL,MOTRIN) 200 MG tablet Take 400-600 mg by mouth every 6 (six) hours as needed for mild pain (pain score 1-3) or moderate pain (pain score 4-6). For pain     minoxidil (ROGAINE) 2 % external solution Apply 1 Application topically daily.     Multiple Vitamins-Minerals (PRESERVISION AREDS 2+MULTI VIT) CAPS Take 1 tablet by mouth 2 (two) times daily.     OVER THE COUNTER MEDICATION Take 1 tablet by mouth daily with breakfast. Alphaloproic Acid=For immune system     Pitavastatin  Calcium  (LIVALO ) 2 MG TABS Take 1/2 tablet by mouth twice a week as directed. (Patient taking differently: Take 1 tablet by mouth See admin instructions. Take 1/2 tablet by mouth twice a week as directed.) 12 tablet 1   Saw Palmetto 500 MG CAPS Take 500 mg by mouth daily with breakfast.     sildenafil (VIAGRA) 100 MG tablet Take 50 mg by mouth as needed for erectile dysfunction.     tamsulosin (FLOMAX) 0.4 MG CAPS capsule Take 0.4 mg by mouth daily.     tretinoin (RETIN-A) 0.05 % cream Apply 1 Application topically at bedtime.     No current facility-administered medications for this encounter.     REVIEW OF SYSTEMS: On review of systems, the patient reports that he is doing well overall.  He denies any chest pain, shortness of breath, cough, fevers, chills, night sweats, unintended weight changes.  He denies any bowel or bladder disturbances, and denies abdominal pain, nausea or vomiting.  He denies any new musculoskeletal  or joint aches or pains.  He reports no recent changes in his urinary habits.  A complete review of systems is obtained and is otherwise negative.     PHYSICAL EXAM:  Wt Readings from Last 3 Encounters:  04/20/24 160 lb (72.6 kg)  04/17/24 164 lb (74.4 kg)  04/07/24 164 lb 8 oz (74.6 kg)   Temp Readings from Last 3 Encounters:  04/20/24 98.1 F (36.7 C) (Oral)  04/07/24 97.8 F (36.6 C) (Temporal)  01/04/20 97.7 F (36.5 C)   BP Readings from Last 3 Encounters:  04/20/24 139/70  04/17/24 118/80  04/07/24 126/75   Pulse Readings  from Last 3 Encounters:  04/20/24 (!) 51  04/17/24 (!) 59  04/07/24 62   Pain Assessment Pain Score: 0-No pain/10  In general this is a well appearing male in no acute distress.  He's alert and oriented x4 and appropriate throughout the examination. Cardiopulmonary assessment is negative for acute distress and he exhibits normal effort.     ECOG = 0  0 - Asymptomatic (Fully active, able to carry on all predisease activities without restriction)  1 - Symptomatic but completely ambulatory (Restricted in physically strenuous activity but ambulatory and able to carry out work of a light or sedentary nature. For example, light housework, office work)  2 - Symptomatic, <50% in bed during the day (Ambulatory and capable of all self care but unable to carry out any work activities. Up and about more than 50% of waking hours)  3 - Symptomatic, >50% in bed, but not bedbound (Capable of only limited self-care, confined to bed or chair 50% or more of waking hours)  4 - Bedbound (Completely disabled. Cannot carry on any self-care. Totally confined to bed or chair)  5 - Death   Aurea Blossom MM, Creech RH, Tormey DC, et al. [phone]). Toxicity and response criteria of the [facility]. Am. Hillard Lowes. Oncol. 5 (6): 649-55      IMPRESSION/PLAN: 1.  The patient is a 78 year old male with unfavorable intermediate risk adenocarcinoma of the  prostate.  I again reviewed with him the rationale behind external beam radiation in his clinical scenario, as well as its potential side effects.  Of note, he is scheduled to initiate androgen deprivation therapy the first week of July, and have gold implant fiducial marker placement in late July.  We will make arrangements for him to return to our department for simulation and treatment planning following placement of his fiducial markers.  I encouraged him to contact me in the interim with any questions or concerns he may have.   In a visit lasting 25 minutes, greater than 50% of the time was spent face to face discussing the patient's condition, in preparation for the discussion, and coordinating the patient's care.    Deanie Jupiter A. Cathren Coaster, MD   **Disclaimer: This note was dictated with voice recognition software. Similar sounding words can inadvertently be transcribed and this note may contain transcription errors which may not have been corrected upon publication of note.**

## 2024-04-24 NOTE — Progress Notes (Signed)
 RN spoke with patient to follow up regarding his ADT.  Patient confirmed that he has not received the actual Rx Orgovyx from the pharmacy.  Pending delivery.    RN will follow up next week to ensure patient receives medication and confirm start date and will update Dr. Melvern Stack office for radiation start date.   Patient verbalized understanding and agreement.  All questions answered.

## 2024-05-01 NOTE — Progress Notes (Signed)
 Patient started Orgovyx on 6/16.  Fiducial's, spaceOAR scheduled for 7/25.  RN will notify Dr. Melvern Stack office of ADT start date and verification of CT Simulation.

## 2024-05-04 ENCOUNTER — Telehealth: Payer: Self-pay

## 2024-05-04 NOTE — Telephone Encounter (Signed)
-----   Message from Nurse Almarie RAMAN sent at 05/01/2024 12:21 PM EDT ----- Regarding: ADT Walterine Falling,   I promised the patient I would let you guys know once he started his ADT.   Patient started Orgovyx on 6/16.   He is scheduled for his fiducial marker's and spaceOAR gel placement on 7/25.   Do you mind letting me know when he get scheduled for his CT Simulation?   Thanks so much!  [place]

## 2024-05-07 ENCOUNTER — Other Ambulatory Visit: Payer: Self-pay | Admitting: Cardiology

## 2024-05-11 ENCOUNTER — Telehealth: Payer: Self-pay

## 2024-05-11 MED ORDER — PITAVASTATIN CALCIUM 2 MG PO TABS: 0.5000 | ORAL_TABLET | ORAL | Status: DC

## 2024-05-11 NOTE — Telephone Encounter (Signed)
 Pt c/o medication issue:  1. Name of Medication: Pitavastatin  Calcium  2 MG TABS   2. How are you currently taking this medication (dosage and times per day)? N/A  3. Are you having a reaction (difficulty breathing--STAT)? No   4. What is your medication issue? Pharmacy would like clarification on medication instructions. Please advise

## 2024-05-11 NOTE — Telephone Encounter (Signed)
 Spoke with [facility] and clarified that Pitvastatin is 1 mg twice weekly.

## 2024-05-19 ENCOUNTER — Encounter (HOSPITAL_COMMUNITY): Payer: Self-pay

## 2024-05-19 ENCOUNTER — Other Ambulatory Visit: Payer: Self-pay | Admitting: Cardiology

## 2024-05-20 ENCOUNTER — Other Ambulatory Visit: Payer: Self-pay | Admitting: *Deleted

## 2024-05-20 ENCOUNTER — Other Ambulatory Visit: Payer: Self-pay | Admitting: Cardiology

## 2024-05-20 MED ORDER — DILTIAZEM HCL ER COATED BEADS 120 MG PO CP24: 120.0000 mg | ORAL_CAPSULE | Freq: Two times a day (BID) | ORAL | 3 refills | Status: AC

## 2024-05-20 NOTE — Telephone Encounter (Signed)
 Refill of Diltiazem  sent to [facility] for pt who walked into office

## 2024-05-20 NOTE — Telephone Encounter (Signed)
*  STAT* If patient is at the pharmacy, call can be transferred to refill team.   1. Which medications need to be refilled? (please list name of each medication and dose if known) diltiazem  (CARDIZEM  CD) 120 MG 24 hr capsule   2. Which pharmacy/location (including street and city if local pharmacy) is medication to be sent to? [facility] - [place], [place] - [address]   3. Do they need a 30 day or 90 day supply? 90

## 2024-05-20 NOTE — Telephone Encounter (Signed)
 Pt is completely out per pharmacy

## 2024-05-20 NOTE — Telephone Encounter (Signed)
 Pt requesting a refill on medication diltiazem . This is a Dr. Monetta pt that was last seen by Dr. Bernie. Would Dr. Krasowski like to refill this medication or does pt still need to see Dr. Monetta? Please address

## 2024-05-21 ENCOUNTER — Ambulatory Visit (HOSPITAL_BASED_OUTPATIENT_CLINIC_OR_DEPARTMENT_OTHER)
Admission: RE | Admit: 2024-05-21 | Discharge: 2024-05-21 | Disposition: A | Discharge status: Home / Self Care | Source: Ambulatory Visit | Attending: Cardiology | Admitting: Cardiology

## 2024-05-21 ENCOUNTER — Ambulatory Visit

## 2024-05-21 DIAGNOSIS — R072 Precordial pain: Secondary | ICD-10-CM | POA: Diagnosis not present

## 2024-05-21 MED ORDER — METOPROLOL TARTRATE 5 MG/5ML IV SOLN: 10.0000 mg | Freq: Once | INTRAVENOUS | Status: DC | PRN

## 2024-05-21 MED ORDER — DILTIAZEM HCL 25 MG/5ML IV SOLN
10.0000 mg | INTRAVENOUS | Status: DC | PRN
Start: 2024-05-21 — End: 2024-05-22

## 2024-05-21 MED ORDER — IOHEXOL 350 MG/ML SOLN
100.0000 mL | Freq: Once | INTRAVENOUS | Status: AC | PRN
  Administered 2024-05-21: 100 mL via INTRAVENOUS

## 2024-05-21 MED ORDER — NITROGLYCERIN 0.4 MG SL SUBL
0.8000 mg | SUBLINGUAL_TABLET | Freq: Once | SUBLINGUAL | Status: AC
  Administered 2024-05-21: 0.8 mg via SUBLINGUAL

## 2024-05-21 NOTE — Telephone Encounter (Signed)
 Pt's medication was sent to pt's pharmacy as requested. Confirmation received.

## 2024-05-23 ENCOUNTER — Ambulatory Visit (HOSPITAL_BASED_OUTPATIENT_CLINIC_OR_DEPARTMENT_OTHER)
Admission: RE | Admit: 2024-05-23 | Discharge: 2024-05-23 | Disposition: A | Discharge status: Home / Self Care | Source: Ambulatory Visit | Attending: Cardiology | Admitting: Cardiology

## 2024-05-23 ENCOUNTER — Other Ambulatory Visit: Payer: Self-pay | Admitting: Cardiology

## 2024-05-23 DIAGNOSIS — R931 Abnormal findings on diagnostic imaging of heart and coronary circulation: Secondary | ICD-10-CM | POA: Diagnosis not present

## 2024-05-23 NOTE — H&P (View-Only) (Signed)
 FFR Order

## 2024-05-23 NOTE — Progress Notes (Signed)
 FFR Order

## 2024-05-25 ENCOUNTER — Ambulatory Visit: Attending: Cardiology | Admitting: Cardiology

## 2024-05-25 ENCOUNTER — Other Ambulatory Visit: Payer: Self-pay

## 2024-05-25 VITALS — BP 146/70 | HR 66 | Ht 66.0 in | Wt 160.8 lb

## 2024-05-25 DIAGNOSIS — C61 Malignant neoplasm of prostate: Secondary | ICD-10-CM

## 2024-05-25 DIAGNOSIS — I25118 Atherosclerotic heart disease of native coronary artery with other forms of angina pectoris: Secondary | ICD-10-CM | POA: Diagnosis not present

## 2024-05-25 DIAGNOSIS — E782 Mixed hyperlipidemia: Secondary | ICD-10-CM | POA: Diagnosis not present

## 2024-05-25 DIAGNOSIS — I10 Essential (primary) hypertension: Secondary | ICD-10-CM | POA: Diagnosis not present

## 2024-05-25 NOTE — Patient Instructions (Signed)
 Medication Instructions:  Your physician recommends that you continue on your current medications as directed. Please refer to the Current Medication list given to you today.   *If you need a refill on your cardiac medications before your next appointment, please call your pharmacy*   Lab Work: Your physician recommends that you have a BMET and CBC today in the office for your upcoming procedure.  If you have labs (blood work) drawn today and your tests are completely normal, you will receive your results only by: MyChart Message (if you have MyChart) OR A paper copy in the mail If you have any lab test that is abnormal or we need to change your treatment, we will call you to review the results.   Testing/Procedures:  [place] [facility] DEPT OF [place]. [facility] [place] [address] [place] [address] Dept: [phone] Loc: [phone]  Brad Hamilton  05/25/2024  You are scheduled for a Cardiac Catheterization on Wednesday, July 16 with Dr. Lonni Cash.  1. Please arrive at the [facility] (Main Entrance A) at [facility]: [address] [place], [place] [address] at 12:30 PM (This time is 2 hour(s) before your procedure to ensure your preparation).   Free valet parking service is available. You will check in at ADMITTING. The support person will be asked to wait in the waiting room.  It is OK to have someone drop you off and come back when you are ready to be discharged.    Special note: Every effort is made to have your procedure done on time. Please understand that emergencies sometimes delay scheduled procedures.  2. Diet: Do not eat solid foods after midnight.  The patient may have clear liquids until 5am upon the day of the procedure.  3. Labs: You had labs done today.  4. Medication instructions in preparation for your procedure:   Contrast Allergy: No    Stop taking, Advil or Motrin  (Ibuprofen) Wednesday, July 16,   On the morning of your procedure, take your Aspirin  81 mg and any morning medicines NOT listed above.  You may use sips of water.  5. Plan to go home the same day, you will only stay overnight if medically necessary. 6. Bring a current list of your medications and current insurance cards. 7. You MUST have a responsible person to drive you home. 8. Someone MUST be with you the first 24 hours after you arrive home or your discharge will be delayed. 9. Please wear clothes that are easy to get on and off and wear slip-on shoes.  Thank you for allowing us  to care for you!   -- [facility]    Follow-Up: At [facility], you and your health needs are our priority.  As part of our continuing mission to provide you with exceptional heart care, we have created designated Provider Care Teams.  These Care Teams include your primary Cardiologist (physician) and Advanced Practice Providers (APPs -  Physician Assistants and Nurse Practitioners) who all work together to provide you with the care you need, when you need it.  We recommend signing up for the patient portal called MyChart.  Sign up information is provided on this After Visit Summary.  MyChart is used to connect with patients for Virtual Visits (Telemedicine).  Patients are able to view lab/test results, encounter notes, upcoming appointments, etc.  Non-urgent messages can be sent to your provider as well.  To learn more about what you can do with MyChart, go to [email or web address].    Your next appointment:   4 week(s)  The format for your next appointment:   In Person  Provider:   Lamar Fitch, MD   Other Instructions  Coronary Angiogram With Stent Coronary angiogram with stent placement is a procedure to widen or open a narrow blood vessel of the heart (coronary artery). Arteries may become blocked by cholesterol buildup (plaques) in the lining of the  artery wall. When a coronary artery becomes partially blocked, blood flow to that area decreases. This may lead to chest pain or a heart attack (myocardial infarction). A stent is a small piece of metal that looks like mesh or spring. Stent placement may be done as treatment after a heart attack, or to prevent a heart attack if a blocked artery is found by a coronary angiogram. Let your health care provider know about: Any allergies you have, including allergies to medicines or contrast dye. All medicines you are taking, including vitamins, herbs, eye drops, creams, and over-the-counter medicines. Any problems you or family members have had with anesthetic medicines. Any blood disorders you have. Any surgeries you have had. Any medical conditions you have, including kidney problems or kidney failure. Whether you are pregnant or may be pregnant. Whether you are breastfeeding. What are the risks? Generally, this is a safe procedure. However, serious problems may occur, including: Damage to nearby structures or organs, such as the heart, blood vessels, or kidneys. A return of blockage. Bleeding, infection, or bruising at the insertion site. A collection of blood under the skin (hematoma) at the insertion site. A blood clot in another part of the body. Allergic reaction to medicines or dyes. Bleeding into the abdomen (retroperitoneal bleeding). Stroke (rare). Heart attack (rare). What happens before the procedure? Staying hydrated Follow instructions from your health care provider about hydration, which may include: Up to 2 hours before the procedure - you may continue to drink clear liquids, such as water, clear fruit juice, black coffee, and plain tea.    Eating and drinking restrictions Follow instructions from your health care provider about eating and drinking, which may include: 8 hours before the procedure - stop eating heavy meals or foods, such as meat, fried foods, or fatty  foods. 6 hours before the procedure - stop eating light meals or foods, such as toast or cereal. 2 hours before the procedure - stop drinking clear liquids. Medicines Ask your health care provider about: Changing or stopping your regular medicines. This is especially important if you are taking diabetes medicines or blood thinners. Taking medicines such as aspirin  and ibuprofen. These medicines can thin your blood. Do not take these medicines unless your health care provider tells you to take them. Generally, aspirin  is recommended before a thin tube, called a catheter, is passed through a blood vessel and inserted into the heart (cardiac catheterization). Taking over-the-counter medicines, vitamins, herbs, and supplements. General instructions Do not use any products that contain nicotine or tobacco for at least 4 weeks before the procedure. These products include cigarettes, e-cigarettes, and chewing tobacco. If you need help quitting, ask your health care provider. Plan to have someone take you home from the hospital or clinic. If you will be going home right after the procedure, plan to have someone with you for 24 hours. You may have tests and imaging procedures. Ask your health care provider: How your insertion site will be marked. Ask which artery will  be used for the procedure. What steps will be taken to help prevent infection. These may include: Removing hair at the insertion site. Washing skin with a germ-killing soap. Taking antibiotic medicine. What happens during the procedure? An IV will be inserted into one of your veins. Electrodes may be placed on your chest to monitor your heart rate during the procedure. You will be given one or more of the following: A medicine to help you relax (sedative). A medicine to numb the area (local anesthetic) for catheter insertion. A small incision will be made for catheter insertion. The catheter will be inserted into an artery using a  guide wire. The location may be in your groin, your wrist, or the fold of your arm (near your elbow). An X-ray procedure (fluoroscopy) will be used to help guide the catheter to the opening of the heart arteries. A dye will be injected into the catheter. X-rays will be taken. The dye helps to show where any narrowing or blockages are located in the arteries. Tell your health care provider if you have chest pain or trouble breathing. A tiny wire will be guided to the blocked spot, and a balloon will be inflated to make the artery wider. The stent will be expanded to crush the plaques into the wall of the vessel. The stent will hold the area open and improve the blood flow. Most stents have a drug coating to reduce the risk of the stent narrowing over time. The artery may be made wider using a drill, laser, or other tools that remove plaques. The catheter will be removed when the blood flow improves. The stent will stay where it was placed, and the lining of the artery will grow over it. A bandage (dressing) will be placed on the insertion site. Pressure will be applied to stop bleeding. The IV will be removed. This procedure may vary among health care providers and hospitals.    What happens after the procedure? Your blood pressure, heart rate, breathing rate, and blood oxygen level will be monitored until you leave the hospital or clinic. If the procedure is done through the leg, you will lie flat in bed for a few hours or for as long as told by your health care provider. You will be instructed not to bend or cross your legs. The insertion site and the pulse in your foot or wrist will be checked often. You may have more blood tests, X-rays, and a test that records the electrical activity of your heart (electrocardiogram, or ECG). Do not drive for 24 hours if you were given a sedative during your procedure. Summary Coronary angiogram with stent placement is a procedure to widen or open a narrowed  coronary artery. This is done to treat heart problems. Before the procedure, let your health care provider know about all the medical conditions and surgeries you have or have had. This is a safe procedure. However, some problems may occur, including damage to nearby structures or organs, bleeding, blood clots, or allergies. Follow your health care provider's instructions about eating, drinking, medicines, and other lifestyle changes, such as quitting tobacco use before the procedure. This information is not intended to replace advice given to you by your health care provider. Make sure you discuss any questions you have with your health care provider. Document Revised: 05/20/2019 Document Reviewed: 05/20/2019 Elsevier Patient Education  2021 [employer].

## 2024-05-25 NOTE — Progress Notes (Signed)
 Cardiology Office Note:    Date:  05/25/2024   ID:  Brad Hamilton, DOB [birth year], MRN [id number]  PCP:  Ofilia Lamar CROME, MD  Cardiologist:  Lamar Fitch, MD    Referring MD: Ofilia Lamar CROME, MD   No chief complaint on file.   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  Recently also he was discovered to have a prostate cancer no metastasis identified Gleason scale is 7 radiation therapy has been planned.  He did have coronary CT angio done coronary CT angio showed potentially hemodynamically significant stenosis of proximal RCA as well as distal RCA, on top of that there is some so some question about potentially having left main coronary artery disease.  He comes today to my office to talk about this.  Overall he is doing well.  He denies having any typical chest pain tightness squeezing pressure burning chest but sometimes have this episode of lightheadedness.  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   Acquired porokeratosis 11/26/2019   Acute laryngotracheitis 03/20/2017   Overview:  2018   Acute non-recurrent sinusitis 06/03/2018   2019  02/13/2023   10/28/2023     Adrenal nodule (HCC) 07/18/2017   Arthralgia of right temporomandibular joint 06/28/2017   Overview:  2018   Benign hypertension 03/05/2016   Blue toe syndrome of right lower extremity (HCC) 01/16/2023   Cancer (HCC) 2005   skin cancer on abdomen   Cellulitis of right toe 12/06/2022   12/06/2022 : medial     Complication of anesthesia    hyper  coming out of anesthesia 1987 cystoscopy   Coronary artery calcification seen on CT scan 04/17/2016   Cough    occasional cough with allergies   COVID-19 determined by clinical diagnostic criteria 11/29/2020   11/29/2020     Diminished pulses in lower extremity 01/10/2023   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   2020: colonoscopy     Erectile dysfunction 09/16/2021   Essential hypertension 04/17/2016   Foot drop, right    Gastritis 07/18/2017   GERD (gastroesophageal reflux disease)    Hearing loss 07/18/2017   Hoarseness 02/05/2024   02/05/2024: ENT     Hyperlipidemia 07/18/2017   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   Ingrown nail 01/02/2023   Injury of left shoulder 04/03/2019   Kidney stones 4 yrs ago   passed on own   Low back pain 01/11/2020   Lumbar spondylosis 04/19/2020   Malignant neoplasm of prostate (HCC) 04/07/2024   Nephrolithiasis 10/22/2017   1966: onset  2018: mult intercurrent events     Onychomycosis 11/26/2019   Other fatigue 07/09/2019   2020     Pain in both thighs 10/08/2022   10/08/2022     Pain in thoracic spine 04/15/2020   Pain of right lower extremity January 11, 2020   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   04/2022: iPTH 95  05/23/2022: NM showed left inf adenoma     Primary hyperparathyroidism (HCC) 06/25/2022   Prostate cancer [facility])    Prostate cancer (HCC)    Renal colic on left side 03/09/2024  03/09/2024     Screening for diabetes mellitus (DM) 04/19/2016   Skin cancer 07/18/2017   Sleep apnea 07/18/2017   Spider veins of both lower extremities 10/16/2019   Tinea pedis of both feet 11/26/2019   Upper abdominal pain 11/15/2023   11/15/2023     Varicose veins of bilateral lower extremities with other complications 04/01/2019   Venous stasis dermatitis of both lower extremities 04/01/2019   Vertigo 10/22/2017   2014: sudden on hearing loss AU with brief verigo/impusion/nausea  - eval ENT then ENT [facility], no findings other than hearing loss     Viral  respiratory illness 07/26/2022    Past Surgical History:  Procedure Laterality Date   APPENDECTOMY  11/12/1956   CARDIAC CATHETERIZATION  11/12/1996   CERVICAL LAMINECTOMY  11/13/1975   COLONOSCOPY  06/25/2019   Dr Misenheimer. Diminutive rectal polyp (1) Diverticular disease left colon.   COLONOSCOPY  05/20/2014   Dr Larene. Dimunutive colon polyps (3) Diverticular disease left colon   CYSTOSCOPY  11/12/1985   LAMINECTOMY  11/12/2009   LUMBAR LAMINECTOMY/DECOMPRESSION MICRODISCECTOMY  04/03/2012   Procedure: LUMBAR LAMINECTOMY/DECOMPRESSION MICRODISCECTOMY 1 LEVEL;  Surgeon: Lynwood SHAUNNA Bern, MD;  Location: [facility];  Service: Orthopedics;  Laterality: Right;  Microdiscectomy L4 - L5 on the Right (X-Ray)    sinus surgery x 2 1980's      Current Medications: Current Meds  Medication Sig   aspirin  EC 81 MG tablet Take 81 mg by mouth daily with breakfast.   buPROPion (WELLBUTRIN XL) 150 MG 24 hr tablet Take 150 mg by mouth daily with breakfast.   CIALIS 20 MG tablet Take 20 mg by mouth daily as needed for erectile dysfunction.   cloNIDine (CATAPRES) 0.1 MG tablet Take 0.1 mg by mouth daily as needed (Elevated BP).   diltiazem  (CARDIZEM  CD) 120 MG 24 hr capsule Take 1 capsule (120 mg total) by mouth 2 (two) times daily.   ezetimibe  (ZETIA ) 10 MG tablet Take 1 tablet (10 mg total) by mouth daily. Patient needs appointment for further refills. 1 st attempt   ibuprofen (ADVIL,MOTRIN) 200 MG tablet Take 400-600 mg by mouth every 6 (six) hours as needed for mild pain (pain score 1-3) or moderate pain (pain score 4-6). For pain   minoxidil (ROGAINE) 2 % external solution Apply 1 Application topically daily.   Multiple Vitamins-Minerals (PRESERVISION AREDS 2+MULTI VIT) CAPS Take 1 tablet by mouth 2 (two) times daily.   ORGOVYX 120 MG tablet Take 120 mg by mouth daily.   OVER THE COUNTER MEDICATION Take 1 tablet by mouth daily with breakfast. Alphaloproic Acid=For immune system    Pitavastatin  Calcium  2 MG TABS Take 0.5 tablets (1 mg total) by mouth 2 (two) times a week. Take 1/2 tablet by mouth twice a week as directed.   Saw Palmetto 500 MG CAPS Take 500 mg by mouth daily with breakfast.   sildenafil (VIAGRA) 100 MG tablet Take 50 mg by mouth as needed for erectile dysfunction.   tamsulosin (FLOMAX) 0.4 MG CAPS capsule Take 0.4 mg by mouth daily.   tretinoin (RETIN-A) 0.05 % cream Apply 1 Application topically at bedtime.     Allergies:   Statins and Zanaflex [tizanidine]   Social History   Socioeconomic History   Marital status: Single    Spouse name: Not on file   Number of children: 4   Years of education: Not on file   Highest education level: Bachelor's degree (e.g., BA, AB, BS)  Occupational History   Occupation: Clinical research associate  Tobacco Use   Smoking status: Former    Current packs/day: 0.00    Average packs/day: 2.0 packs/day for 30.0 years (60.0 ttl pk-yrs)    Types: Cigarettes    Start date: 07/12/1970    Quit date: 07/12/2000    Years since quitting: 23.8    Passive exposure: Past   Smokeless tobacco: Never  Vaping Use   Vaping status: Never Used  Substance and Sexual Activity   Alcohol use: Yes    Alcohol/week: 2.0 standard drinks of alcohol    Types: 1 Glasses of wine, 1 Cans of beer per week    Comment: 1 beer or wine per day   Drug use: Never   Sexual activity: Yes  Other Topics Concern   Not on file  Social History Narrative   Law degree   Social Drivers of Health   Financial Resource Strain: Not on file  Food Insecurity: No Food Insecurity (04/20/2024)   Hunger Vital Sign    Worried About Running Out of Food in the Last Year: Never true    Ran Out of Food in the Last Year: Never true  Transportation Needs: No Transportation Needs (04/20/2024)   PRAPARE - Administrator, Civil Service (Medical): No    Lack of Transportation (Non-Medical): No  Physical Activity: Not on file  Stress: Not on file  Social Connections: Not on  file     Family History: The patient's family history includes Bleeding Disorder in his mother; Cervical cancer (age of onset: 25) in his mother; Diabetes in his father and mother; Heart attack in his sister; Heart disease in his mother; Heart failure in his father; Hypertension in his father, mother, and sister; Leukemia in his cousin; Multiple myeloma (age of onset: 79) in his maternal uncle; Multiple myeloma (age of onset: 83) in his maternal grandmother; Prostate cancer (age of onset: 106 - 81) in his father. There is no history of Colon cancer, Stomach cancer, Esophageal cancer, or Colon polyps. ROS:   Please see the history of present illness.    All 14 point review of systems negative except as described per history of present illness  EKGs/Labs/Other Studies Reviewed:      Prox RCA:    Distal RCA:    LM:         Recent Labs: No results found for requested labs within last 365 days.  Recent Lipid Panel    Component Value Date/Time   CHOL 157 03/13/2023 1502   TRIG 189 (H) 03/13/2023 1502   HDL 50 03/13/2023 1502   CHOLHDL 3.1 03/13/2023 1502   LDLCALC 75 03/13/2023 1502    Physical Exam:    VS:  BP (!) 146/70   Pulse 66   Ht 5' 6 (1.676 m)   Wt 160 lb 12.8 oz (72.9 kg)   SpO2 95%   BMI 25.95 kg/m     Wt Readings from Last 3 Encounters:  05/25/24 160 lb 12.8 oz (72.9 kg)  04/20/24 160 lb (72.6 kg)  04/17/24 164 lb (74.4 kg)     GEN:  Well nourished, well developed in no acute distress HEENT: Normal NECK: No JVD; No carotid bruits LYMPHATICS: No lymphadenopathy CARDIAC: RRR, no murmurs, no rubs, no gallops RESPIRATORY:  Clear to auscultation without rales, wheezing or rhonchi  ABDOMEN: Soft, non-tender, non-distended MUSCULOSKELETAL:  No edema; No deformity  SKIN: Warm and dry LOWER EXTREMITIES: no swelling NEUROLOGIC:  Alert and oriented x 3 PSYCHIATRIC:  Normal affect   ASSESSMENT:  1. Mixed hyperlipidemia   2. Coronary artery disease of  native artery of native heart with stable angina pectoris (HCC)   3. Benign hypertension   4. Malignant neoplasm of prostate (HCC)    PLAN:    In order of problems listed above:  Abnormal coronary CT angio with right coronary artery disease as well as question about potential left main coronary artery disease.  Had a long discussion with him about what to do with the situation, I strongly recommended cardiac catheterization other options with medical therapy.  He elected to proceed with cardiac catheterization, procedure has been explained to him including all risk benefits alternative.  He agreed to proceed.  In the meantime he is taking antiplatelet therapy, as well as Zetia  also be atorvastatin which I will continue for now.  Will add long-acting nitroglycerin . Benign essential hypertension blood pressure slightly on the high side but she is excited about the topic that we discussed today. Malignant neoplasm of prostate.  Scheduled for radiation therapy.   Medication Adjustments/Labs and Tests Ordered: Current medicines are reviewed at length with the patient today.  Concerns regarding medicines are outlined above.  Orders Placed This Encounter  Procedures   EKG 12-Lead   Medication changes: No orders of the defined types were placed in this encounter.   Signed, Lamar DOROTHA Fitch, MD, [facility] 05/25/2024 12:26 PM    [facility]

## 2024-05-26 ENCOUNTER — Telehealth: Payer: Self-pay | Admitting: *Deleted

## 2024-05-26 LAB — BASIC METABOLIC PANEL WITH GFR
BUN/Creatinine Ratio: 20 (ref 10–24)
BUN: 22 mg/dL (ref 8–27)
CO2: 20 mmol/L (ref 20–29)
Calcium: 9.8 mg/dL (ref 8.6–10.2)
Chloride: 103 mmol/L (ref 96–106)
Creatinine, Ser: 1.12 mg/dL (ref 0.76–1.27)
Glucose: 91 mg/dL (ref 70–99)
Potassium: 4.9 mmol/L (ref 3.5–5.2)
Sodium: 141 mmol/L (ref 134–144)
eGFR: 67 mL/min/1.73 (ref >59)

## 2024-05-26 LAB — CBC
Hematocrit: 39.1 % (ref 37.5–51.0)
Hemoglobin: 12.6 g/dL — ABNORMAL LOW (ref 13.0–17.7)
MCH: 32.4 pg (ref 26.6–33.0)
MCHC: 32.2 g/dL (ref 31.5–35.7)
MCV: 101 fL — ABNORMAL HIGH (ref 79–97)
Platelets: 207 x10E3/uL (ref 150–450)
RBC: 3.89 x10E6/uL — ABNORMAL LOW (ref 4.14–5.80)
RDW: 12.7 % (ref 11.6–15.4)
WBC: 8.6 x10E3/uL (ref 3.4–10.8)

## 2024-05-26 NOTE — Progress Notes (Signed)
 RN reached out to Dr. Fonda office to coordinate for CT Simulation.  Patient is now scheduled for CT Sim on 7/28 at 11:15am.  Patient aware.

## 2024-05-26 NOTE — Telephone Encounter (Signed)
 Cardiac Catheterization scheduled at [facility] for: Wednesday May 27, 2024 2:30 PM Arrival time [facility] Main Entrance A at: 12:30 PM  Nothing to eat after midnight prior to procedure, clear liquids until 5 AM day of procedure.  Medication instructions: -Usual morning medications can be taken with sips of water including aspirin  81 mg.  Plan to go home the same day, you will only stay overnight if medically necessary.  You must have responsible adult to drive you home.  Someone must be with you the first 24 hours after you arrive home.  Reviewed procedure instructions with patient.

## 2024-05-27 ENCOUNTER — Ambulatory Visit (HOSPITAL_COMMUNITY)
Admission: RE | Admit: 2024-05-27 | Discharge: 2024-05-27 | Disposition: A | Discharge status: Home / Self Care | Attending: Cardiovascular Disease | Admitting: Cardiovascular Disease

## 2024-05-27 ENCOUNTER — Other Ambulatory Visit: Payer: Self-pay

## 2024-05-27 ENCOUNTER — Encounter (HOSPITAL_COMMUNITY)
Admission: RE | Disposition: A | Discharge status: Home / Self Care | Payer: Self-pay | Source: Home / Self Care | Attending: Cardiovascular Disease

## 2024-05-27 DIAGNOSIS — I10 Essential (primary) hypertension: Secondary | ICD-10-CM | POA: Diagnosis not present

## 2024-05-27 DIAGNOSIS — Z7982 Long term (current) use of aspirin: Secondary | ICD-10-CM | POA: Insufficient documentation

## 2024-05-27 DIAGNOSIS — I2510 Atherosclerotic heart disease of native coronary artery without angina pectoris: Secondary | ICD-10-CM

## 2024-05-27 DIAGNOSIS — E785 Hyperlipidemia, unspecified: Secondary | ICD-10-CM | POA: Diagnosis not present

## 2024-05-27 DIAGNOSIS — Z79899 Other long term (current) drug therapy: Secondary | ICD-10-CM | POA: Diagnosis not present

## 2024-05-27 DIAGNOSIS — C61 Malignant neoplasm of prostate: Secondary | ICD-10-CM | POA: Insufficient documentation

## 2024-05-27 DIAGNOSIS — G4730 Sleep apnea, unspecified: Secondary | ICD-10-CM | POA: Diagnosis not present

## 2024-05-27 HISTORY — PX: LEFT HEART CATH AND CORONARY ANGIOGRAPHY: CATH118249

## 2024-05-27 SURGERY — LEFT HEART CATH AND CORONARY ANGIOGRAPHY
Anesthesia: LOCAL

## 2024-05-27 MED ORDER — IOHEXOL 350 MG/ML SOLN
INTRAVENOUS | Status: DC | PRN
  Administered 2024-05-27: 35 mL

## 2024-05-27 MED ORDER — FENTANYL CITRATE (PF) 100 MCG/2ML IJ SOLN
INTRAMUSCULAR | Status: DC | PRN
  Administered 2024-05-27 (×2): 25 ug via INTRAVENOUS

## 2024-05-27 MED ORDER — VERAPAMIL HCL 2.5 MG/ML IV SOLN
INTRAVENOUS | Status: DC | PRN
  Administered 2024-05-27: 10 mL via INTRA_ARTERIAL

## 2024-05-27 MED ORDER — FENTANYL CITRATE (PF) 100 MCG/2ML IJ SOLN
INTRAMUSCULAR | Status: AC
  Filled 2024-05-27: qty 2

## 2024-05-27 MED ORDER — MIDAZOLAM HCL 2 MG/2ML IJ SOLN
INTRAMUSCULAR | Status: AC
  Filled 2024-05-27: qty 2

## 2024-05-27 MED ORDER — LIDOCAINE HCL (PF) 1 % IJ SOLN
INTRAMUSCULAR | Status: DC | PRN
  Administered 2024-05-27: 2 mL

## 2024-05-27 MED ORDER — SODIUM CHLORIDE 0.9 % WEIGHT BASED INFUSION: 1.0000 mL/kg/h | INTRAVENOUS | Status: DC

## 2024-05-27 MED ORDER — HEPARIN (PORCINE) IN NACL 2000-0.9 UNIT/L-% IV SOLN
INTRAVENOUS | Status: DC | PRN
  Administered 2024-05-27: 1000 mL

## 2024-05-27 MED ORDER — SODIUM CHLORIDE 0.9 % WEIGHT BASED INFUSION: 3.0000 mL/kg/h | INTRAVENOUS | Status: AC

## 2024-05-27 MED ORDER — ASPIRIN 81 MG PO CHEW: 81.0000 mg | CHEWABLE_TABLET | ORAL | Status: DC

## 2024-05-27 MED ORDER — VERAPAMIL HCL 2.5 MG/ML IV SOLN
INTRAVENOUS | Status: AC
  Filled 2024-05-27: qty 2

## 2024-05-27 MED ORDER — HEPARIN SODIUM (PORCINE) 1000 UNIT/ML IJ SOLN
INTRAMUSCULAR | Status: DC | PRN
  Administered 2024-05-27: 4000 [IU] via INTRAVENOUS

## 2024-05-27 MED ORDER — MIDAZOLAM HCL 2 MG/2ML IJ SOLN
INTRAMUSCULAR | Status: DC | PRN
Start: 2024-05-27 — End: 2024-05-27
  Administered 2024-05-27 (×2): 1 mg via INTRAVENOUS

## 2024-05-27 MED ORDER — HEPARIN SODIUM (PORCINE) 1000 UNIT/ML IJ SOLN
INTRAMUSCULAR | Status: AC
  Filled 2024-05-27: qty 10

## 2024-05-27 MED ORDER — LIDOCAINE HCL (PF) 1 % IJ SOLN
INTRAMUSCULAR | Status: AC
  Filled 2024-05-27: qty 30

## 2024-05-27 SURGICAL SUPPLY — 6 items
CATH 5FR JL3.5 JR4 ANG PIG MP (CATHETERS) IMPLANT
DEVICE RAD COMP TR BAND LRG (VASCULAR PRODUCTS) IMPLANT
GLIDESHEATH SLEND SS 6F .021 (SHEATH) IMPLANT
GUIDEWIRE INQWIRE 1.5J.035X260 (WIRE) IMPLANT
PACK CARDIAC CATHETERIZATION (CUSTOM PROCEDURE TRAY) ×1 IMPLANT
SET ATX-X65L (MISCELLANEOUS) IMPLANT

## 2024-05-27 NOTE — Interval H&P Note (Signed)
 History and Physical Interval Note:  05/27/2024 2:29 PM  Brad Hamilton  has presented today for surgery, with the diagnosis of ab. cardiac ct.  The various methods of treatment have been discussed with the patient and family. After consideration of risks, benefits and other options for treatment, the patient has consented to  Procedure(s): LEFT HEART CATH AND CORONARY ANGIOGRAPHY (N/A) as a surgical intervention.  The patient's history has been reviewed, patient examined, no change in status, stable for surgery.  I have reviewed the patient's chart and labs.  Questions were answered to the patient's satisfaction.    Cath Lab Visit (complete for each Cath Lab visit)  Clinical Evaluation Leading to the Procedure:   ACS: No.  Non-ACS:    Anginal Classification: No Symptoms  Anti-ischemic medical therapy: Minimal Therapy (1 class of medications)  Non-Invasive Test Results: High-risk stress test findings: cardiac mortality >3%/year (Coronary CTA with possible severe RCA stenosis)  Prior CABG: No previous CABG    Lonni Cash

## 2024-05-28 ENCOUNTER — Telehealth: Payer: Self-pay | Admitting: Cardiology

## 2024-05-28 ENCOUNTER — Encounter (HOSPITAL_COMMUNITY): Payer: Self-pay | Admitting: Cardiovascular Disease

## 2024-05-28 NOTE — Telephone Encounter (Signed)
 Called patient to schedule echo, but patient isn't sure this is necessary since he just had a cath. Please advise.  Thank you

## 2024-06-01 DIAGNOSIS — R002 Palpitations: Secondary | ICD-10-CM

## 2024-06-02 ENCOUNTER — Encounter (HOSPITAL_COMMUNITY): Payer: Self-pay | Admitting: Urology

## 2024-06-02 NOTE — Progress Notes (Signed)
 Spoke w/ via phone for pre-op interview--- Brad Hamilton needs dos----   BMP per anesthesia.      Hamilton results------Current EKG in Epic dated 05/25/24. COVID test -----patient states asymptomatic no test needed Arrive at -------0630 NPO after MN NO Solid Food.   Pre-Surgery Ensure or G2:  Med rec completed Medications to take morning of surgery ----- Diltiazem , Wellbutrin, Clonidine PRN and Orgovyx. Diabetic medication -----  GLP1 agonist last dose: GLP1 instructions:  Patient instructed no nail polish to be worn day of surgery Patient instructed to bring photo id and insurance card day of surgery Patient aware to have Driver (ride ) / caregiver    for 24 hours after surgery - Daughter Deane Blumenthal Patient Special Instructions ----- Fleet enema per surgeons instructions. Shower with antibacterial soap. Pre-Op special Instructions -----  Patient verbalized understanding of instructions that were given at this phone interview. Patient denies chest pain, sob, fever, cough at the interview.   Anesthesia Review: Yes, Dr Corinne. Stated blood thinner instructions up to surgeon. As long as pt was asymptomatic okay to proceed. Spoke with patient for preop phone call patient stated he was asymptomatic.  PCP: Dr Ofilia Cardiologist : Dr Bernie  PPM/ ICD: Device Orders: Rep Notified:  Chest x-ray : EKG : 05/25/24 Echo : Stress test: Cardiac Cath : 05/27/24  Activity level:  Sleep Study/ CPAP :N/A Fasting Blood Sugar :      / Checks Blood Sugar -- times a day:  N/A  Blood Thinner/ Instructions /Last Dose: ASA / Instructions/ Last Dose :  Per pt instructed to hold ASA 5 days prior to procedure.

## 2024-06-02 NOTE — Progress Notes (Signed)
 Chart reviewed w/ anesthesia prior to pre-op interview due to pt having recent cardiac cath on 05-28-2024 with significant disease but per cardiology being medically managed and on anticoagulate (plavix).  Per Dr Corinne MDA patient ok to proceed if patient is asymptomatic and it is up to surgeon about blood thinner if patient needs to stop or not then will need clearance.

## 2024-06-04 ENCOUNTER — Ambulatory Visit: Admitting: Cardiology

## 2024-06-04 ENCOUNTER — Ambulatory Visit: Payer: Self-pay | Admitting: Cardiology

## 2024-06-04 NOTE — H&P (Signed)
 Office Visit Report     05/18/2024   --------------------------------------------------------------------------------   Brad Hamilton. Brad Hamilton  MRN: [id number]  DOB: 1946/09/01, 78 year old Male  SSN:    PRIMARY CARE:  Robert L. Ofilia, MD  PRIMARY CARE FAX:  [phone]  REFERRING:  Norleen Seltzer, MD  PROVIDER:  Norleen Seltzer, M.D.  LOCATION:  [facility] [phone]     --------------------------------------------------------------------------------   CC/HPI: CC: Prostate Cancer   05/18/24: [name] returns today in f/u. He is tolerating Orgovyx and is scheduled for fiducial markers on 7/25. He has some fatigue but no hot flashes. his PSA is down to 0.46 from <20. His LFT's were ok. His IPSS is 10 wituh nocturia x 3 and some urgency. that is stable. He is on calcium  and vit D.   Physician requesting consult: Dr. Norleen Seltzer  PCP: Dr. Lamar Ofilia  Location of consult: [facility] [place] - [facility]   Mr. Brad Hamilton is a 78 year old gentleman with a history of hypertension, hyperlipidemia, and sleep apnea who was found to have a concerning prostate exam with nodularity encompassing the majority of the right prostate lobe and an elevated PSA of 4.5. He underwent a TRUS biopsy of the prostate on 01/31/24 that confirmed Gleason 4+3=7 adenocarcinoma with 6 out of 12 biopsy cores positive all on the right side.   He has been on testosterone before sometime since he was under the care of Dr. Sallyanne. His primary symptom was fatigue/energy loss. 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   Family history: None.   Imaging studies: PSMA PET scan (02/20/24) - No evidence of clear metastatic disease although there was a question of an isolated presacral area of uptake that may correlate with a 3 to 4 mm atypical lymph node versus nerve ganglia. Uptake on the right side of the prostate consistent with high volume disease.   PMH: He  has a history of hypertension, hyperlipidemia, and sleep apnea.  PSH: Open appendectomy as a child.   TNM stage: cT2b N0 M0 (he does have induration along the right side of the prostate with no evidence of extraprostatic disease)  PSA: 4.5  Gleason score: 4+3=7 (GG3)  Biopsy (01/31/24): 6/12 cores positive  Left: Benign  Right: R apex (50%, 4+3=7), R lateral apex (50%, 4+3=7), R mid (60%, 4+3=7), R lateral mid (80%, 4+3=7), R base (80%, 4+3=7), R lateral base (50%, 4+3=7)  Prostate volume: 26.0 cc   Nomogram  OC disease: 24%  EPE: 75%  SVI: 24%  LNI: 21%  PFS (5 year, 10 year): 50%, 35%   Urinary function: IPSS is 16.  Erectile function: SHIM score is 13.     ALLERGIES: Statins    MEDICATIONS: Cialis  Viagra  Alpha Lipoic Acid  Aspirin  EC Low Dose 81 MG Tablet Delayed Release  Benicar 20 MG Tablet  dilTIAZem  HCl ER 120 MG Capsule Extended Release 12 Hour  Icaps Areds2  levoFLOXacin 1 po 1 hour prior to the procedure  Livalo  2 MG Tablet 1 tablet PO Daily  Orgovyx 120 MG Tablet 1 tablet PO Daily  Saw Palmetto  Vitamin C   Wellbutrin Sr  Zetia  10 MG Tablet  Zinc     GU PSH: Prostate Needle Biopsy - 01/31/2024       PSH Notes: laminectomy- 8022,7991,7988   NON-GU PSH: Appendectomy (open) - 8042 Sinus surgery - 1987 Surgical Pathology, Gross And Microscopic Examination For Prostate Needle -  01/31/2024 Visit Complexity (formerly GPC1X) - 03/11/2024, 12/11/2023, 11/18/2023     GU PMH: Prostate Cancer (Stable) - 04/07/2024, He has T2c N0 M0 high volume GG3 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 He has moderate LUTS with an IPSS of 18 and moderate ED. He is on TRT for hypogonadism. He has uptake in a presacral lesion that is felt to be a ganglion but I will review with radiology. I discussed options and risks including surveillance, RALP, seeds, EXRT possibly with ADT, and briefly cryo and HIFU and gave him a hand out with details. I will get him set up to be seen in the  [facility] I have recommended that he stop the Testosterone replacement pending his treatment. , - 03/11/2024 ED due to arterial insufficiency - 03/11/2024, - 11/18/2023, He has both tadalafil and sildenafil on hand so I discussed low dose combination therapy with 10mg  of tadalafil and 40-60mg  of sildenafil. I also discussed shockwave therapy. , - 2023, He has sufficient Cialis. , - 2021 Incomplete bladder emptying - 03/11/2024 Nocturia - 03/11/2024 Primary hypogonadism (Stable), Stop TRT. - 03/11/2024, He will stay on TRT pending the biopsy. , - 12/11/2023, - 11/18/2023, - 09/05/2023, - 08/23/2022, He is doing well on current dosing and his script is current. He will return in 6 months with labs. , - 2023, Testosterone refilled. F/u in 6 months. , - 2022, I will check labs and filled his script. He will return in 6months with labs. , - 2021 Prostate nodule w/ LUTS, He has moderate LUTS and with the extent of his disease is probably not a good candidate for seeds. - 03/11/2024, - 01/31/2024, - 12/11/2023 Urinary Frequency - 03/11/2024 Weak Urinary Stream - 03/11/2024 Elevated PSA - 01/31/2024, He has a rising PSA that is only 4.5 but he has a large nodule replacing the right prostate and needs a biopsy. I have reviewed the risks of bleeding, infection and voiding difficulty. Levaquin sent. , - 12/11/2023, - 09/05/2023, - 08/23/2022, He will have a PSA at f/u. , - 2023, His PSA had doubled from 1 to 2.2 at last check. His exam is benign. I will repeat the PSA today. , - 2021 Urinary Urgency - 12/11/2023 History of urolithiasis - 05/24/2022, No stones seen on KUB. , - 2022, He has had no recent symptoms. I will get a KUB on return. 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 Has not smoked since 07/13/2000. Smoked for 40 years. Smoked 2 packs per day.   Tobacco Use Assessment Completed: Used Tobacco in last 30 days? Drinks 2 drinks per day.  Drinks 3 caffeinated drinks per day. Patient's occupation Sales promotion account executive.    REVIEW OF SYSTEMS:    GU Review Male:   Patient denies frequent urination, hard to postpone urination, burning/ pain with urination, get up at night to urinate, leakage of urine, stream starts and stops, trouble starting your stream, have to strain to urinate , erection problems, and penile pain.  Gastrointestinal (Upper):   Patient denies nausea, vomiting, and indigestion/ heartburn.  Gastrointestinal (Lower):   Patient  denies diarrhea and constipation.  Constitutional:   Patient denies night sweats, weight loss, fever, and fatigue.  Skin:   Patient denies skin rash/ lesion and itching.  Eyes:   Patient denies blurred vision and double vision.  Ears/ Nose/ Throat:   Patient denies sore throat and sinus problems.  Hematologic/Lymphatic:   Patient denies swollen glands and easy bruising.  Cardiovascular:   Patient denies leg swelling and chest pains.  Respiratory:   Patient denies cough and shortness of breath.  Endocrine:   Patient denies excessive thirst.  Musculoskeletal:   Patient denies back pain and joint pain.  Neurological:   Patient denies headaches and dizziness.  Psychologic:   Patient denies depression and anxiety.   VITAL SIGNS: None   MULTI-SYSTEM PHYSICAL EXAMINATION:    Constitutional: Well-nourished. No physical deformities. Normally developed. Good grooming.  Respiratory: Normal breath sounds. No labored breathing, no use of accessory muscles.   Cardiovascular: Regular rate and rhythm. No murmur, no gallop.      Complexity of Data:  Lab Test Review:    PSA, Total Testosterone, Hepatic Function Panel  Records Review:   AUA Symptom Score, Previous Patient Records   12/05/23 08/29/23 10/11/22 08/13/22 07/21/20  PSA  Total PSA 4.50 ng/mL 3.63 ng/mL 1.86 ng/mL 2.68 ng/mL 1.53 ng/mL  Free PSA 0.86 ng/mL      % Free PSA 19 % PSA        12/05/23 08/29/23 08/13/22 12/11/21 03/10/21  Hormones  Testosterone, Total 504.1 ng/dL 434.8 ng/dL 132.0 ng/dL 163.7 ng/dL 509.6 ng/dL    PROCEDURES:          Visit Complexity - G2211 Chronic management         Urinalysis Dipstick Dipstick Cont'd  Color: Yellow Bilirubin: Neg mg/dL  Appearance: Clear Ketones: Neg mg/dL  Specific Gravity: 8.974 Blood: Neg ery/uL  pH: <=5.0 Protein: Trace mg/dL  Glucose: Neg mg/dL Urobilinogen: 0.2 mg/dL    Nitrites: Neg    Leukocyte Esterase: Neg leu/uL    ASSESSMENT:      ICD-10 Details  1 GU:   Prostate Cancer - C61 Chronic, Stable, Improving - His PSA is falling with ADT. He is on for fiducials and spaceOAR on 06/05/24. Procedure and recovery discussed.   2   Prostate nodule w/ LUTS - N40.3 Chronic, Stable, Improving - His urinary symptoms are improving.   3   Urinary Urgency - R39.15 Chronic, Improving  4   Nocturia - R35.1 Chronic, Improving   PLAN:           Schedule Labs: 4 Months - PSA    4 Months - CMP    4 Months - Urinalysis    4 Months - Total Testosterone  Return Visit/Planned Activity: 4 Months - Office Visit          Document Letter(s):  Created for Patient: Clinical Summary         Notes:   CC: Dr. Lamar Hobby and Dr. Adina Barge.         Next Appointment:      Next Appointment: 06/05/2024 08:30 AM    Appointment Type: Surgery     Location: [facility] [phone]    Provider: Donnice Brooks, M.D.    Reason for Visit: OP--CONE--FIDUCIAL MARK/OAR      * Signed by Norleen Seltzer, M.D. on 05/18/24 at 4:36 PM (EDT)*

## 2024-06-05 ENCOUNTER — Encounter (HOSPITAL_COMMUNITY): Payer: Self-pay | Admitting: Urology

## 2024-06-05 ENCOUNTER — Ambulatory Visit (HOSPITAL_COMMUNITY)
Admission: RE | Admit: 2024-06-05 | Discharge: 2024-06-05 | Disposition: A | Discharge status: Home / Self Care | Attending: Urology | Admitting: Urology

## 2024-06-05 ENCOUNTER — Ambulatory Visit (HOSPITAL_COMMUNITY): Admitting: Anesthesiology

## 2024-06-05 ENCOUNTER — Encounter (HOSPITAL_COMMUNITY)
Admission: RE | Disposition: A | Discharge status: Home / Self Care | Payer: Self-pay | Source: Home / Self Care | Attending: Urology

## 2024-06-05 DIAGNOSIS — N403 Nodular prostate with lower urinary tract symptoms: Secondary | ICD-10-CM | POA: Diagnosis not present

## 2024-06-05 DIAGNOSIS — Z87891 Personal history of nicotine dependence: Secondary | ICD-10-CM | POA: Insufficient documentation

## 2024-06-05 DIAGNOSIS — I2510 Atherosclerotic heart disease of native coronary artery without angina pectoris: Secondary | ICD-10-CM | POA: Insufficient documentation

## 2024-06-05 DIAGNOSIS — I739 Peripheral vascular disease, unspecified: Secondary | ICD-10-CM | POA: Insufficient documentation

## 2024-06-05 DIAGNOSIS — R3915 Urgency of urination: Secondary | ICD-10-CM | POA: Insufficient documentation

## 2024-06-05 DIAGNOSIS — I10 Essential (primary) hypertension: Secondary | ICD-10-CM | POA: Insufficient documentation

## 2024-06-05 DIAGNOSIS — G4730 Sleep apnea, unspecified: Secondary | ICD-10-CM | POA: Insufficient documentation

## 2024-06-05 DIAGNOSIS — R351 Nocturia: Secondary | ICD-10-CM | POA: Insufficient documentation

## 2024-06-05 DIAGNOSIS — C61 Malignant neoplasm of prostate: Secondary | ICD-10-CM | POA: Insufficient documentation

## 2024-06-05 DIAGNOSIS — Z79899 Other long term (current) drug therapy: Secondary | ICD-10-CM | POA: Insufficient documentation

## 2024-06-05 HISTORY — PX: GOLD SEED IMPLANT: SHX6343

## 2024-06-05 HISTORY — PX: SPACE OAR INSTILLATION: SHX6769

## 2024-06-05 LAB — BASIC METABOLIC PANEL WITH GFR
Anion gap: 9 (ref 5–15)
BUN: 26 mg/dL — ABNORMAL HIGH (ref 8–23)
CO2: 22 mmol/L (ref 22–32)
Calcium: 9.4 mg/dL (ref 8.9–10.3)
Chloride: 108 mmol/L (ref 98–111)
Creatinine, Ser: 1.14 mg/dL (ref 0.61–1.24)
GFR, Estimated: >60 mL/min (ref >60)
Glucose, Bld: 89 mg/dL (ref 70–99)
Potassium: 4 mmol/L (ref 3.5–5.1)
Sodium: 139 mmol/L (ref 135–145)

## 2024-06-05 SURGERY — INSERTION, GOLD SEEDS
Anesthesia: Monitor Anesthesia Care | Site: Prostate

## 2024-06-05 MED ORDER — DEXAMETHASONE SODIUM PHOSPHATE 10 MG/ML IJ SOLN
INTRAMUSCULAR | Status: AC
  Filled 2024-06-05: qty 1

## 2024-06-05 MED ORDER — GLYCOPYRROLATE 0.2 MG/ML IJ SOLN
INTRAMUSCULAR | Status: DC | PRN
  Administered 2024-06-05: .2 mg via INTRAVENOUS

## 2024-06-05 MED ORDER — CEFAZOLIN SODIUM-DEXTROSE 2-4 GM/100ML-% IV SOLN
INTRAVENOUS | Status: AC
Start: 2024-06-05 — End: 2024-06-05
  Filled 2024-06-05: qty 100

## 2024-06-05 MED ORDER — FENTANYL CITRATE (PF) 100 MCG/2ML IJ SOLN: 25.0000 ug | INTRAMUSCULAR | Status: DC | PRN

## 2024-06-05 MED ORDER — CEFAZOLIN SODIUM-DEXTROSE 2-4 GM/100ML-% IV SOLN
2.0000 g | INTRAVENOUS | Status: AC
  Administered 2024-06-05: 2 g via INTRAVENOUS

## 2024-06-05 MED ORDER — EPHEDRINE SULFATE-NACL 50-0.9 MG/10ML-% IV SOSY
PREFILLED_SYRINGE | INTRAVENOUS | Status: DC | PRN
  Administered 2024-06-05: 5 mg via INTRAVENOUS

## 2024-06-05 MED ORDER — ORAL CARE MOUTH RINSE: 15.0000 mL | Freq: Once | OROMUCOSAL | Status: AC

## 2024-06-05 MED ORDER — FLEET ENEMA RE ENEM
1.0000 | ENEMA | Freq: Once | RECTAL | Status: DC
  Filled 2024-06-05: qty 1

## 2024-06-05 MED ORDER — SODIUM CHLORIDE (PF) 0.9 % IJ SOLN
INTRAMUSCULAR | Status: DC | PRN
  Administered 2024-06-05: 10 mL

## 2024-06-05 MED ORDER — LIDOCAINE HCL (PF) 1 % IJ SOLN
INTRAMUSCULAR | Status: DC | PRN
  Administered 2024-06-05: 10 mL

## 2024-06-05 MED ORDER — FENTANYL CITRATE (PF) 250 MCG/5ML IJ SOLN
INTRAMUSCULAR | Status: DC | PRN
  Administered 2024-06-05: 100 ug via INTRAVENOUS

## 2024-06-05 MED ORDER — LIDOCAINE 2% (20 MG/ML) 5 ML SYRINGE
INTRAMUSCULAR | Status: AC
  Filled 2024-06-05: qty 5

## 2024-06-05 MED ORDER — ONDANSETRON HCL 4 MG/2ML IJ SOLN
INTRAMUSCULAR | Status: AC
Start: 2024-06-05 — End: 2024-06-05
  Filled 2024-06-05: qty 2

## 2024-06-05 MED ORDER — PROPOFOL 500 MG/50ML IV EMUL
INTRAVENOUS | Status: DC | PRN
  Administered 2024-06-05: 150 ug/kg/min via INTRAVENOUS

## 2024-06-05 MED ORDER — LACTATED RINGERS IV SOLN: INTRAVENOUS | Status: DC

## 2024-06-05 MED ORDER — FENTANYL CITRATE (PF) 250 MCG/5ML IJ SOLN
INTRAMUSCULAR | Status: AC
  Filled 2024-06-05: qty 5

## 2024-06-05 MED ORDER — CHLORHEXIDINE GLUCONATE 0.12 % MT SOLN
OROMUCOSAL | Status: AC
  Filled 2024-06-05: qty 15

## 2024-06-05 MED ORDER — ACETAMINOPHEN 10 MG/ML IV SOLN: 1000.0000 mg | Freq: Once | INTRAVENOUS | Status: DC | PRN

## 2024-06-05 MED ORDER — ONDANSETRON HCL 4 MG/2ML IJ SOLN
INTRAMUSCULAR | Status: DC | PRN
  Administered 2024-06-05: 4 mg via INTRAVENOUS

## 2024-06-05 MED ORDER — CHLORHEXIDINE GLUCONATE 0.12 % MT SOLN
15.0000 mL | Freq: Once | OROMUCOSAL | Status: AC
  Administered 2024-06-05: 15 mL via OROMUCOSAL

## 2024-06-05 MED ORDER — DEXAMETHASONE SODIUM PHOSPHATE 10 MG/ML IJ SOLN
INTRAMUSCULAR | Status: DC | PRN
  Administered 2024-06-05: 5 mg via INTRAVENOUS

## 2024-06-05 SURGICAL SUPPLY — 23 items
BLADE CLIPPER SENSICLIP SURGIC (BLADE) ×1 IMPLANT
CNTNR URN SCR LID CUP LEK RST (MISCELLANEOUS) ×1 IMPLANT
COVER BACK TABLE 60X90IN (DRAPES) ×1 IMPLANT
DRSG TEGADERM 4X4.75 (GAUZE/BANDAGES/DRESSINGS) ×1 IMPLANT
DRSG TEGADERM 8X12 (GAUZE/BANDAGES/DRESSINGS) ×1 IMPLANT
GAUZE SPONGE 4X4 12PLY STRL (GAUZE/BANDAGES/DRESSINGS) ×1 IMPLANT
GLOVE BIO SURGEON STRL SZ7.5 (GLOVE) ×1 IMPLANT
GLOVE BIO SURGEON STRL SZ8 (GLOVE) IMPLANT
GLOVE SURG ORTHO 8.5 STRL (GLOVE) ×1 IMPLANT
IMPL SPACEOAR SYSTEM 10ML (Spacer) ×1 IMPLANT
MARKER GOLD PRELOAD 1.2X3 (Urological Implant) ×1 IMPLANT
MARKER SKIN DUAL TIP RULER LAB (MISCELLANEOUS) ×1 IMPLANT
NDL SPNL 22GX3.5 QUINCKE BK (NEEDLE) ×1 IMPLANT
NEEDLE SPNL 22GX3.5 QUINCKE BK (NEEDLE) ×1 IMPLANT
SHEATH ULTRASOUND LF (SHEATH) IMPLANT
SHEATH ULTRASOUND LTX NONSTRL (SHEATH) IMPLANT
SLEEVE SCD COMPRESS KNEE MED (STOCKING) ×1 IMPLANT
SURGILUBE 2OZ TUBE FLIPTOP (MISCELLANEOUS) ×1 IMPLANT
SYR 10ML LL (SYRINGE) IMPLANT
SYR CONTROL 10ML LL (SYRINGE) ×1 IMPLANT
TOWEL GREEN STERILE (TOWEL DISPOSABLE) IMPLANT
TOWEL OR 17X24 6PK STRL BLUE (TOWEL DISPOSABLE) ×1 IMPLANT
UNDERPAD 30X36 HEAVY ABSORB (UNDERPADS AND DIAPERS) ×1 IMPLANT

## 2024-06-05 NOTE — Discharge Instructions (Addendum)
 Transrectal Ultrasound-Guided Prostate Gold Seed and Biodegradable Gel Placement, Care After  What can I expect after the procedure? After the procedure, it is common to have: Pain and discomfort near your butt (rectum), especially while sitting. Bruising or tenderness in the area behind the scrotum (perineum), if the needle was put into your prostate through this area. Pink-colored pee (urine). This is due to small amounts of blood in your pee. A burning feeling while peeing. Blood in your semen. Follow these instructions at home: Medicines Take over-the-counter and prescription medicines only as told by your doctor. If you were given a sedative during your procedure, do not drive or use machines until your doctor says that it is safe. A sedative is a medicine that helps you relax. If you were prescribed an antibiotic medicine, take it as told by your doctor. Do not stop taking it even if you start to feel better. Activity  Return to your normal activities when your doctor says that it is safe. Ask your doctor when it is okay for you to have sex. Avoid strenuous activity and heavy lifting greater than 10-15 lbs for 2-3 days.  General instructions  Drink enough water to keep your pee pale yellow. Watch your pee, poop, and semen for new bleeding or bleeding that gets worse. Keep all follow-up visits. Contact a doctor if: You have any of these: Blood clots in your pee or poop. New or worse bleeding in your pee, poop, or semen. Very bad belly pain. Your pee smells bad or unusual. You have trouble peeing. Your lower belly feels firm. You have problems getting an erection. You feel like you may vomit (are nauseous), or you vomit. Get help right away if: You have a fever or chills. You have bright red pee. You have very bad pain that does not get better with medicine. You cannot pee. Summary After this procedure, it is common to have pain and discomfort near your butt, especially  while sitting. You may have blood in your pee and poop. It is common to have blood in your semen. Get help right away if you have a fever or chills.  This information is not intended to replace advice given to you by your health care provider. Make sure you discuss any questions you have with your health care provider. The following information offers guidance on how to care for yourself after your procedure. Your health care provider may also give you more specific instructions. If you have problems or questions, contact your health care provider.  Post Anesthesia Home Care Instructions  Activity: Get plenty of rest for the remainder of the day. A responsible individual must stay with you for 24 hours following the procedure.  For the next 24 hours, DO NOT: -Drive a car -Advertising copywriter -Drink alcoholic beverages -Take any medication unless instructed by your physician -Make any legal decisions or sign important papers.  Meals: Start with liquid foods such as gelatin or soup. Progress to regular foods as tolerated. Avoid greasy, spicy, heavy foods. If nausea and/or vomiting occur, drink only clear liquids until the nausea and/or vomiting subsides. Call your physician if vomiting continues.  Special Instructions/Symptoms: Your throat may feel dry or sore from the anesthesia or the breathing tube placed in your throat during surgery. If this causes discomfort, gargle with warm salt water. The discomfort should disappear within 24 hours.  If you had a scopolamine patch placed behind your ear for the management of post- operative nausea and/or vomiting:  1. The medication in the patch is effective for 72 hours, after which it should be removed.  Wrap patch in a tissue and discard in the trash. Wash hands thoroughly with soap and water. 2. You may remove the patch earlier than 72 hours if you experience unpleasant side effects which may include dry mouth, dizziness or visual  disturbances. 3. Avoid touching the patch. Wash your hands with soap and water after contact with the patch.

## 2024-06-05 NOTE — Interval H&P Note (Signed)
 History and Physical Interval Note:  06/05/2024 7:41 AM  Brad Hamilton  has presented today for surgery, with the diagnosis of PROSTATE CANCER.  The various methods of treatment have been discussed with the patient and family. After consideration of risks, benefits and other options for treatment, the patient has consented to  Procedure(s): INSERTION, GOLD SEEDS (N/A) INJECTION, HYDROGEL SPACER (N/A) as a surgical intervention.  The patient's history has been reviewed, patient examined, no change in status, stable for surgery.  I have reviewed the patient's chart and labs.  He is well today. No cough, or congestion. No fever or dysuria. I discussed with the patient and family the nature, potential benefits, risks and alternatives to TRUS gold seed prostate marker placement and spaceoar gel insertion, including side effects of the proposed treatment, the likelihood of the patient achieving the goals of the procedure, and any potential problems that might occur during the procedure or recuperation. Discussed risk of bleeding, infection and rectal, bladder or urethral injury among others. All questions answered. Patient elects to proceed. Questions were answered to the patient's satisfaction.  He elects to proceed.    Donnice Brooks

## 2024-06-05 NOTE — Transfer of Care (Signed)
 Immediate Anesthesia Transfer of Care Note  Patient: Brad Hamilton  Procedure(s) Performed: INSERTION, GOLD SEEDS (Prostate) INJECTION, HYDROGEL SPACER (Prostate)  Patient Location: PACU  Anesthesia Type:MAC  Level of Consciousness: drowsy  Airway & Oxygen Therapy: Patient Spontanous Breathing and Patient connected to face mask oxygen  Post-op Assessment: Report given to RN and Post -op Vital signs reviewed and stable  Post vital signs: Reviewed and stable  Last Vitals:  Vitals Value Taken Time  BP 101/60 06/05/24 09:21  Temp    Pulse 62 06/05/24 09:22  Resp 12 06/05/24 09:22  SpO2 94 % 06/05/24 09:22  Vitals shown include unfiled device data.  Last Pain:  Vitals:   06/05/24 0641  TempSrc: Oral  PainSc: 0-No pain      Patients Stated Pain Goal: 3 (06/05/24 0641)  Complications: No notable events documented.

## 2024-06-05 NOTE — Anesthesia Postprocedure Evaluation (Signed)
 Anesthesia Post Note  Patient: Rocky Gladden  Procedure(s) Performed: INSERTION, GOLD SEEDS (Prostate) INJECTION, HYDROGEL SPACER (Prostate)     Patient location during evaluation: PACU Anesthesia Type: MAC Level of consciousness: awake and alert Pain management: pain level controlled Vital Signs Assessment: post-procedure vital signs reviewed and stable Respiratory status: spontaneous breathing, nonlabored ventilation, respiratory function stable and patient connected to nasal cannula oxygen Cardiovascular status: stable and blood pressure returned to baseline Postop Assessment: no apparent nausea or vomiting Anesthetic complications: no   No notable events documented.  Last Vitals:  Vitals:   06/05/24 0926 06/05/24 0945  BP: 121/71   Pulse:    Resp:    Temp:  36.5 C  SpO2:      Last Pain:  Vitals:   06/05/24 0945  TempSrc:   PainSc: 0-No pain                 Cordella SQUIBB Kayd Launer

## 2024-06-05 NOTE — Op Note (Signed)
 Preoperative diagnosis: Prostate cancer Postoperative diagnosis: Prostate cancer  Procedure: Transrectal ultrasound-guided transperineal placement of gold seed prostate fiducial markers and SpaceOAR biodegradable gel  Surgeon: Nieves  Anesthesia: General  Indication for procedure: 78 yo male who is preparing to start external beam radiation.  He presents today for the above.  Findings: Normal-appearing prostate with intact capsule and no hypoechoic. Seminal vesicles appeared normal.  Description of procedure: After consent was obtained patient brought to the operating room.  After adequate anesthesia he was placed in lithotomy position and the scrotum supported superiorly.  The perineum was prepped.  Ultrasound probe inserted rectally.  Prostate imaged in the axial and sagittal views.  10 cc of lidocaine  was instilled in the perineum and at the prostate apex.  The gold seed markers were passed transperineally under ultrasound guidance placing three total with one in the right base, right apex and left mid of the prostate gland.  The 18-gauge needle was then inserted approximately 1 to 2 cm anterior to the anal opening and directed under ultrasonic guidance into the perirectal fat between the anterior rectal wall and the prostate capsule down to the mid-gland. Midline needle position was confirmed in the sagittal and axial views to verify the tip was in the perirectal fat.  Small amounts of saline were injected to hydrodissect the space between the prostate and the anterior rectal wall.  Axial imaging was viewed to confirm the needle was in the correct location in the mid gland and centered.  Aspiration confirmed no intravascular access.  The saline syringe was carefully disconnected maintaining the desired needle position and the hydrogel was attached to the needle.  Under ultrasound guidance in the sagittal view a smooth continuous injection was done over about 12 seconds delivering the hydrogel  into the space between the prostate and rectal wall.  The needle was withdrawn. The TRUS was removed. Post-procedure DRE noted no rectal injury and no blood on gloved finger.   A dressing was placed and the patient was awakened and taken to the cover room in stable condition.  Complications: None  Blood loss: Minimal  Specimens: None  Drains: None  Disposition: Patient stable to PACU

## 2024-06-05 NOTE — Anesthesia Preprocedure Evaluation (Signed)
 Anesthesia Evaluation  Patient identified by MRN, date of birth, ID band Patient awake    Reviewed: Allergy & Precautions, NPO status , Patient's Chart, lab work & pertinent test results  Airway Mallampati: II  TM Distance: >3 FB Neck ROM: Full    Dental no notable dental hx.    Pulmonary sleep apnea , former smoker   Pulmonary exam normal        Cardiovascular hypertension, Pt. on medications + CAD and + Peripheral Vascular Disease   Rhythm:Regular Rate:Normal     Neuro/Psych negative neurological ROS  negative psych ROS   GI/Hepatic Neg liver ROS,GERD  ,,  Endo/Other  negative endocrine ROS    Renal/GU    Prostate Ca negative genitourinary   Musculoskeletal   Abdominal Normal abdominal exam  (+)   Peds  Hematology negative hematology ROS (+)   Anesthesia Other Findings   Reproductive/Obstetrics                              Anesthesia Physical Anesthesia Plan  ASA: 3  Anesthesia Plan: MAC   Post-op Pain Management:    Induction: Intravenous  PONV Risk Score and Plan: 1 and Ondansetron , Dexamethasone , Propofol  infusion and Treatment may vary due to age or medical condition  Airway Management Planned: Simple Face Mask and Nasal Cannula  Additional Equipment: None  Intra-op Plan:   Post-operative Plan:   Informed Consent: I have reviewed the patients History and Physical, chart, labs and discussed the procedure including the risks, benefits and alternatives for the proposed anesthesia with the patient or authorized representative who has indicated his/her understanding and acceptance.     Dental advisory given  Plan Discussed with: CRNA  Anesthesia Plan Comments:         Anesthesia Quick Evaluation

## 2024-06-06 ENCOUNTER — Other Ambulatory Visit: Payer: Self-pay | Admitting: Cardiology

## 2024-06-08 ENCOUNTER — Encounter (HOSPITAL_COMMUNITY): Payer: Self-pay | Admitting: Urology

## 2024-06-08 ENCOUNTER — Ambulatory Visit
Admission: RE | Admit: 2024-06-08 | Discharge: 2024-06-08 | Disposition: A | Discharge status: Home / Self Care | Source: Ambulatory Visit | Attending: Radiation Oncology | Admitting: Radiation Oncology

## 2024-06-08 ENCOUNTER — Ambulatory Visit: Admitting: Radiation Oncology

## 2024-06-08 DIAGNOSIS — C61 Malignant neoplasm of prostate: Secondary | ICD-10-CM | POA: Insufficient documentation

## 2024-06-12 ENCOUNTER — Other Ambulatory Visit: Payer: Self-pay | Admitting: Cardiology

## 2024-06-12 ENCOUNTER — Ambulatory Visit
Admission: RE | Admit: 2024-06-12 | Discharge: 2024-06-12 | Disposition: A | Discharge status: Home / Self Care | Source: Ambulatory Visit | Attending: Radiation Oncology | Admitting: Radiation Oncology

## 2024-06-12 ENCOUNTER — Ambulatory Visit: Admitting: Radiation Oncology

## 2024-06-12 ENCOUNTER — Encounter: Payer: Self-pay | Admitting: Radiation Oncology

## 2024-06-12 DIAGNOSIS — C61 Malignant neoplasm of prostate: Secondary | ICD-10-CM | POA: Insufficient documentation

## 2024-06-12 DIAGNOSIS — Z510 Encounter for antineoplastic radiation therapy: Secondary | ICD-10-CM | POA: Insufficient documentation

## 2024-06-17 ENCOUNTER — Other Ambulatory Visit: Payer: Self-pay

## 2024-06-17 DIAGNOSIS — Z510 Encounter for antineoplastic radiation therapy: Secondary | ICD-10-CM | POA: Diagnosis not present

## 2024-06-17 LAB — RAD ONC ARIA SESSION SUMMARY
Course Elapsed Days: 0
Plan Fractions Treated to Date: 1
Plan Prescribed Dose Per Fraction: 2 Gy
Plan Total Fractions Prescribed: 23
Plan Total Prescribed Dose: 46 Gy
Reference Point Dosage Given to Date: 2 Gy
Reference Point Session Dosage Given: 2 Gy
Session Number: 1

## 2024-06-18 ENCOUNTER — Other Ambulatory Visit

## 2024-06-18 ENCOUNTER — Other Ambulatory Visit: Payer: Self-pay

## 2024-06-18 ENCOUNTER — Ambulatory Visit
Admission: RE | Admit: 2024-06-18 | Discharge: 2024-06-18 | Disposition: A | Discharge status: Home / Self Care | Source: Ambulatory Visit | Attending: Radiation Oncology | Admitting: Radiation Oncology

## 2024-06-18 DIAGNOSIS — Z510 Encounter for antineoplastic radiation therapy: Secondary | ICD-10-CM | POA: Diagnosis not present

## 2024-06-18 LAB — RAD ONC ARIA SESSION SUMMARY
Course Elapsed Days: 1
Plan Fractions Treated to Date: 2
Plan Prescribed Dose Per Fraction: 2 Gy
Plan Total Fractions Prescribed: 23
Plan Total Prescribed Dose: 46 Gy
Reference Point Dosage Given to Date: 4 Gy
Reference Point Session Dosage Given: 2 Gy
Session Number: 2

## 2024-06-19 ENCOUNTER — Other Ambulatory Visit: Payer: Self-pay

## 2024-06-19 ENCOUNTER — Ambulatory Visit
Admission: RE | Admit: 2024-06-19 | Discharge: 2024-06-19 | Disposition: A | Discharge status: Home / Self Care | Source: Ambulatory Visit | Attending: Radiation Oncology | Admitting: Radiation Oncology

## 2024-06-19 DIAGNOSIS — Z510 Encounter for antineoplastic radiation therapy: Secondary | ICD-10-CM | POA: Diagnosis not present

## 2024-06-19 LAB — RAD ONC ARIA SESSION SUMMARY
Course Elapsed Days: 2
Plan Fractions Treated to Date: 3
Plan Prescribed Dose Per Fraction: 2 Gy
Plan Total Fractions Prescribed: 23
Plan Total Prescribed Dose: 46 Gy
Reference Point Dosage Given to Date: 6 Gy
Reference Point Session Dosage Given: 2 Gy
Session Number: 3

## 2024-06-22 ENCOUNTER — Ambulatory Visit
Admission: RE | Admit: 2024-06-22 | Discharge: 2024-06-22 | Disposition: A | Discharge status: Home / Self Care | Source: Ambulatory Visit | Attending: Radiation Oncology

## 2024-06-22 ENCOUNTER — Ambulatory Visit: Attending: Cardiology | Admitting: Cardiology

## 2024-06-22 ENCOUNTER — Other Ambulatory Visit: Payer: Self-pay

## 2024-06-22 ENCOUNTER — Encounter: Payer: Self-pay | Admitting: Cardiology

## 2024-06-22 VITALS — BP 122/60 | HR 66 | Ht 66.0 in | Wt 163.4 lb

## 2024-06-22 DIAGNOSIS — I2510 Atherosclerotic heart disease of native coronary artery without angina pectoris: Secondary | ICD-10-CM | POA: Diagnosis not present

## 2024-06-22 DIAGNOSIS — E782 Mixed hyperlipidemia: Secondary | ICD-10-CM | POA: Diagnosis not present

## 2024-06-22 DIAGNOSIS — I10 Essential (primary) hypertension: Secondary | ICD-10-CM

## 2024-06-22 DIAGNOSIS — C61 Malignant neoplasm of prostate: Secondary | ICD-10-CM

## 2024-06-22 DIAGNOSIS — Z510 Encounter for antineoplastic radiation therapy: Secondary | ICD-10-CM | POA: Diagnosis not present

## 2024-06-22 LAB — RAD ONC ARIA SESSION SUMMARY
Course Elapsed Days: 5
Plan Fractions Treated to Date: 4
Plan Prescribed Dose Per Fraction: 2 Gy
Plan Total Fractions Prescribed: 23
Plan Total Prescribed Dose: 46 Gy
Reference Point Dosage Given to Date: 8 Gy
Reference Point Session Dosage Given: 2 Gy
Session Number: 4

## 2024-06-22 MED ORDER — PITAVASTATIN CALCIUM 4 MG PO TABS: 4.0000 mg | ORAL_TABLET | Freq: Every day | ORAL | 3 refills | Status: AC

## 2024-06-22 NOTE — Patient Instructions (Signed)
 Medication Instructions:  Your physician has recommended you make the following change in your medication:   Increase your Livalo  to 4 mg daily.  *If you need a refill on your cardiac medications before your next appointment, please call your pharmacy*   Lab Work: Your physician recommends that you return for lab work in: 6 weeks for fasting lipid, AST and ALT. You need to have labs done when you are fasting.  You can come Monday through Friday 8:30 am to 12:00 pm and 1:15 to 4:30. You do not need to make an appointment as the order has already been placed. The labs you are going to have done are BMET, CBC, TSH, LFT and Lipids.  If you have labs (blood work) drawn today and your tests are completely normal, you will receive your results only by: MyChart Message (if you have MyChart) OR A paper copy in the mail If you have any lab test that is abnormal or we need to change your treatment, we will call you to review the results.  Testing/Procedures: Your physician has requested that you have an echocardiogram. Echocardiography is a painless test that uses sound waves to create images of your heart. It provides your doctor with information about the size and shape of your heart and how well your heart's chambers and valves are working. This procedure takes approximately one hour. There are no restrictions for this procedure. Please do NOT wear cologne, perfume, aftershave, or lotions (deodorant is allowed). Please arrive 15 minutes prior to your appointment time.  Please note: We ask at that you not bring children with you during ultrasound (echo/ vascular) testing. Due to room size and safety concerns, children are not allowed in the ultrasound rooms during exams. Our front office staff cannot provide observation of children in our lobby area while testing is being conducted. An adult accompanying a patient to their appointment will only be allowed in the ultrasound room at the discretion of the  ultrasound technician under special circumstances. We apologize for any inconvenience.  Follow-Up: At [facility], you and your health needs are our priority.  As part of our continuing mission to provide you with exceptional heart care, we have created designated Provider Care Teams.  These Care Teams include your primary Cardiologist (physician) and Advanced Practice Providers (APPs -  Physician Assistants and Nurse Practitioners) who all work together to provide you with the care you need, when you need it.  We recommend signing up for the patient portal called MyChart.  Sign up information is provided on this After Visit Summary.  MyChart is used to connect with patients for Virtual Visits (Telemedicine).  Patients are able to view lab/test results, encounter notes, upcoming appointments, etc.  Non-urgent messages can be sent to your provider as well.   To learn more about what you can do with MyChart, go to [email or web address].    Your next appointment:   5 month(s)  The format for your next appointment:   In Person  Provider:   Lamar Fitch, MD   Other Instructions Echocardiogram An echocardiogram is a test that uses sound waves (ultrasound) to produce images of the heart. Images from an echocardiogram can provide important information about: Heart size and shape. The size and thickness and movement of your heart's walls. Heart muscle function and strength. Heart valve function or if you have stenosis. Stenosis is when the heart valves are too narrow. If blood is flowing backward through the heart valves (regurgitation). A tumor or infectious  growth around the heart valves. Areas of heart muscle that are not working well because of poor blood flow or injury from a heart attack. Aneurysm detection. An aneurysm is a weak or damaged part of an artery wall. The wall bulges out from the normal force of blood pumping through the body. Tell a health care provider about: Any  allergies you have. All medicines you are taking, including vitamins, herbs, eye drops, creams, and over-the-counter medicines. Any blood disorders you have. Any surgeries you have had. Any medical conditions you have. Whether you are pregnant or may be pregnant. What are the risks? Generally, this is a safe test. However, problems may occur, including an allergic reaction to dye (contrast) that may be used during the test. What happens before the test? No specific preparation is needed. You may eat and drink normally. What happens during the test? You will take off your clothes from the waist up and put on a hospital gown. Electrodes or electrocardiogram (ECG)patches may be placed on your chest. The electrodes or patches are then connected to a device that monitors your heart rate and rhythm. You will lie down on a table for an ultrasound exam. A gel will be applied to your chest to help sound waves pass through your skin. A handheld device, called a transducer, will be pressed against your chest and moved over your heart. The transducer produces sound waves that travel to your heart and bounce back (or echo back) to the transducer. These sound waves will be captured in real-time and changed into images of your heart that can be viewed on a video monitor. The images will be recorded on a computer and reviewed by your health care provider. You may be asked to change positions or hold your breath for a short time. This makes it easier to get different views or better views of your heart. In some cases, you may receive contrast through an IV in one of your veins. This can improve the quality of the pictures from your heart. The procedure may vary among health care providers and hospitals.   What can I expect after the test? You may return to your normal, everyday life, including diet, activities, and medicines, unless your health care provider tells you not to do that. Follow these instructions  at home: It is up to you to get the results of your test. Ask your health care provider, or the department that is doing the test, when your results will be ready. Keep all follow-up visits. This is important. Summary An echocardiogram is a test that uses sound waves (ultrasound) to produce images of the heart. Images from an echocardiogram can provide important information about the size and shape of your heart, heart muscle function, heart valve function, and other possible heart problems. You do not need to do anything to prepare before this test. You may eat and drink normally. After the echocardiogram is completed, you may return to your normal, everyday life, unless your health care provider tells you not to do that. This information is not intended to replace advice given to you by your health care provider. Make sure you discuss any questions you have with your health care provider. Document Revised: 06/21/2020 Document Reviewed: 06/21/2020 Elsevier Patient Education  2021 Elsevier Inc.   Important Information About Sugar

## 2024-06-22 NOTE — Progress Notes (Signed)
 Cardiology Office Note:    Date:  06/22/2024   ID:  Brad Hamilton, Brad Hamilton 1946/01/09, MRN [id number]  PCP:  Ofilia Lamar CROME, MD  Cardiologist:  Lamar Fitch, MD    Referring MD: Ofilia Lamar CROME, MD   No chief complaint on file.   History of Present Illness:    Brad Hamilton is a 78 y.o. male.  I will past medical history significant for coronary disease recently he had cardiac catheterization which showed 70% of mid RCA and 20% LAD.  That was after coronary CT angio showed possibility of left main and significant RCA likely nonobstructive.  Additional problem include essential hypertension, dyslipidemia, prostate cancer he is getting radiotherapy.  Comes to office for follow-up doing great he discovered that was making feel so awful was Flomax, he discontinued this medication doing well he played tennis 3 times a week for about 3 hours with no difficulties.  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   Acquired porokeratosis 11/26/2019   Acute laryngotracheitis 03/20/2017   Overview:  2018   Acute non-recurrent sinusitis 06/03/2018   2019  02/13/2023   10/28/2023     Adrenal nodule (HCC) 07/18/2017   Arthralgia of right temporomandibular joint 06/28/2017   Overview:  2018   Benign hypertension 03/05/2016   Blue toe syndrome of right lower extremity (HCC) 01/16/2023   Cancer (HCC) 2005   skin cancer on abdomen   Cellulitis of right toe 12/06/2022   12/06/2022 : medial     Complication of anesthesia    hyper coming out of anesthesia 1987 cystoscopy   Coronary artery calcification seen on CT scan 04/17/2016   Coronary artery disease 04/17/2016   Cough    occasional cough with allergies   COVID-19 determined by clinical diagnostic criteria 11/29/2020   11/29/2020     Diminished pulses in lower extremity 01/10/2023   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    2020: colonoscopy     Erectile dysfunction 09/16/2021   Essential hypertension 04/17/2016   Foot drop, right    Gastritis 07/18/2017   GERD (gastroesophageal reflux disease)    Hearing loss 07/18/2017   Hoarseness 02/05/2024   02/05/2024: ENT     Hyperlipidemia 07/18/2017   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   Ingrown nail 01/02/2023   Injury of left shoulder 04/03/2019   Kidney stones 4 yrs ago   passed on own   Low back pain January 09, 2020   Lumbar spondylosis 04/19/2020   Malignant neoplasm of prostate (HCC) 04/07/2024   Nephrolithiasis 10/22/2017   1966: onset  2018: mult intercurrent events     Onychomycosis 11/26/2019   Other fatigue 07/09/2019   2020     Pain in both thighs 10/08/2022   10/08/2022     Pain in thoracic spine 04/15/2020   Pain of right lower extremity 01-09-2020   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   04/2022: iPTH 95  05/23/2022: NM showed left inf adenoma     Primary hyperparathyroidism (HCC) 06/25/2022   Prostate cancer (HCC)    Prostate cancer (HCC)    Renal colic on left side 03/09/2024   03/09/2024     Screening for diabetes mellitus (DM) 04/19/2016   Skin cancer 07/18/2017   Sleep apnea 07/18/2017   Spider veins of both lower extremities 10/16/2019   Tinea pedis of both feet 11/26/2019   Upper abdominal pain 11/15/2023   11/15/2023  Varicose veins of bilateral lower extremities with other complications 04/01/2019   Venous stasis dermatitis of both lower extremities 04/01/2019   Vertigo 10/22/2017   2014: sudden on hearing loss AU with brief verigo/impusion/nausea  - eval ENT then ENT [facility], no findings other than hearing loss     Viral respiratory illness 07/26/2022    Past Surgical History:  Procedure Laterality Date   APPENDECTOMY  11/12/1956   CARDIAC CATHETERIZATION  11/12/1996   CERVICAL LAMINECTOMY  11/13/1975   COLONOSCOPY  06/25/2019   Dr Misenheimer. Diminutive rectal polyp (1) Diverticular disease left colon.   COLONOSCOPY  05/20/2014   Dr  Larene. Dimunutive colon polyps (3) Diverticular disease left colon   CYSTOSCOPY  11/12/1985   GOLD SEED IMPLANT N/A 06/05/2024   Procedure: INSERTION, GOLD SEEDS;  Surgeon: Nieves Cough, MD;  Location[facility];  Service: Urology;  Laterality: N/A;   LAMINECTOMY  11/12/2009   LEFT HEART CATH AND CORONARY ANGIOGRAPHY N/A 05/27/2024   Procedure: LEFT HEART CATH AND CORONARY ANGIOGRAPHY;  Surgeon: Verlin Lonni BIRCH, MD;  Location: [facility] INVASIVE CV LAB;  Service: Cardiovascular;  Laterality: N/A;   LUMBAR LAMINECTOMY/DECOMPRESSION MICRODISCECTOMY  04/03/2012   Procedure: LUMBAR LAMINECTOMY/DECOMPRESSION MICRODISCECTOMY 1 LEVEL;  Surgeon: Lynwood SHAUNNA Bern, MD;  Location: [facility] ORS;  Service: Orthopedics;  Laterality: Right;  Microdiscectomy L4 - L5 on the Right (X-Ray)    sinus surgery x 2 1980's     SPACE OAR INSTILLATION N/A 06/05/2024   Procedure: INJECTION, HYDROGEL SPACER;  Surgeon: Nieves Cough, MD;  Location: [facility] OR;  Service: Urology;  Laterality: N/A;    Current Medications: Current Meds  Medication Sig   aspirin  EC 81 MG tablet Take 81 mg by mouth daily with breakfast.   buPROPion (WELLBUTRIN XL) 150 MG 24 hr tablet Take 150 mg by mouth daily with breakfast.   cloNIDine (CATAPRES) 0.1 MG tablet Take 0.1 mg by mouth daily as needed (Elevated BP).   diltiazem  (CARDIZEM  CD) 120 MG 24 hr capsule Take 1 capsule (120 mg total) by mouth 2 (two) times daily.   ezetimibe  (ZETIA ) 10 MG tablet Take 1 tablet (10 mg total) by mouth daily.   fluticasone (FLONASE) 50 MCG/ACT nasal spray Place 1 spray into both nostrils daily.   ibuprofen (ADVIL,MOTRIN) 200 MG tablet Take 400-600 mg by mouth every 6 (six) hours as needed for mild pain (pain score 1-3) or moderate pain (pain score 4-6). For pain   minoxidil (ROGAINE) 2 % external solution Apply 1 Application topically daily.   Multiple Vitamins-Minerals (PRESERVISION AREDS 2+MULTI VIT) CAPS Take 1 tablet by mouth 2 (two) times daily.    olmesartan (BENICAR) 20 MG tablet Take 1 tablet (20 mg total) by mouth daily. Patient needs appointment for further refills. 1 st attempt   ORGOVYX 120 MG tablet Take 120 mg by mouth daily.   OVER THE COUNTER MEDICATION Take 1 tablet by mouth daily with breakfast. Alphaloproic Acid=For immune system   Pitavastatin  Calcium  2 MG TABS Take 0.5 tablets (1 mg total) by mouth 2 (two) times a week. Take 1/2 tablet by mouth twice a week as directed.   Saw Palmetto 450 MG CAPS Take 450 mg by mouth daily with breakfast.   tretinoin (RETIN-A) 0.05 % cream Apply 1 Application topically at bedtime.     Allergies:   Statins and Zanaflex [tizanidine]   Social History   Socioeconomic History   Marital status: Single    Spouse name: Not on file   Number of children: 4  Years of education: Not on file   Highest education level: Bachelor's degree (e.g., BA, AB, BS)  Occupational History   Occupation: lawyer  Tobacco Use   Smoking status: Former    Current packs/day: 0.00    Average packs/day: 2.0 packs/day for 30.0 years (60.0 ttl pk-yrs)    Types: Cigarettes    Start date: 07/12/1970    Quit date: 07/12/2000    Years since quitting: 23.9    Passive exposure: Past   Smokeless tobacco: Never  Vaping Use   Vaping status: Never Used  Substance and Sexual Activity   Alcohol use: Yes    Alcohol/week: 2.0 standard drinks of alcohol    Types: 1 Glasses of wine, 1 Cans of beer per week    Comment: 1 beer or wine per day   Drug use: Never   Sexual activity: Yes  Other Topics Concern   Not on file  Social History Narrative   Law degree   Social Drivers of Health   Financial Resource Strain: Not on file  Food Insecurity: No Food Insecurity (04/20/2024)   Hunger Vital Sign    Worried About Running Out of Food in the Last Year: Never true    Ran Out of Food in the Last Year: Never true  Transportation Needs: No Transportation Needs (04/20/2024)   PRAPARE - Administrator, Civil Service  (Medical): No    Lack of Transportation (Non-Medical): No  Physical Activity: Not on file  Stress: Not on file  Social Connections: Not on file     Family History: The patient's family history includes Bleeding Disorder in his mother; Cervical cancer (age of onset: 66) in his mother; Diabetes in his father and mother; Heart attack in his sister; Heart disease in his mother; Heart failure in his father; Hypertension in his father, mother, and sister; Leukemia in his cousin; Multiple myeloma (age of onset: 66) in his maternal uncle; Multiple myeloma (age of onset: 68) in his maternal grandmother; Prostate cancer (age of onset: 70 - 58) in his father. There is no history of Colon cancer, Stomach cancer, Esophageal cancer, or Colon polyps. ROS:   Please see the history of present illness.    All 14 point review of systems negative except as described per history of present illness  EKGs/Labs/Other Studies Reviewed:         Recent Labs: 05/25/2024: Hemoglobin 12.6; Platelets 207 06/05/2024: BUN 26; Creatinine, Ser 1.14; Potassium 4.0; Sodium 139  Recent Lipid Panel    Component Value Date/Time   CHOL 157 03/13/2023 1502   TRIG 189 (H) 03/13/2023 1502   HDL 50 03/13/2023 1502   CHOLHDL 3.1 03/13/2023 1502   LDLCALC 75 03/13/2023 1502    Physical Exam:    VS:  BP 122/60   Pulse 66   Ht 5' 6 (1.676 m)   Wt 163 lb 6.4 oz (74.1 kg)   SpO2 90%   BMI 26.37 kg/m     Wt Readings from Last 3 Encounters:  06/22/24 163 lb 6.4 oz (74.1 kg)  06/05/24 158 lb (71.7 kg)  05/27/24 158 lb (71.7 kg)     GEN:  Well nourished, well developed in no acute distress HEENT: Normal NECK: No JVD; No carotid bruits LYMPHATICS: No lymphadenopathy CARDIAC: RRR, no murmurs, no rubs, no gallops RESPIRATORY:  Clear to auscultation without rales, wheezing or rhonchi  ABDOMEN: Soft, non-tender, non-distended MUSCULOSKELETAL:  No edema; No deformity  SKIN: Warm and dry LOWER EXTREMITIES: no  swelling  NEUROLOGIC:  Alert and oriented x 3 PSYCHIATRIC:  Normal affect   ASSESSMENT:    1. Coronary artery disease involving native coronary artery of native heart without angina pectoris   2. Benign hypertension   3. Prostate cancer (HCC)   4. Mixed hyperlipidemia    PLAN:    In order of problems listed above:  Coronary disease nonobstructive continue antiplatelet therapy and risk modifications. Essential hypertension blood pressure well-controlled continue present management. Prostate cancer on radiation therapy. Dyslipidemia, will double the dose of Livalo  his LDL is 99.  Target LDL level less than 55   Medication Adjustments/Labs and Tests Ordered: Current medicines are reviewed at length with the patient today.  Concerns regarding medicines are outlined above.  No orders of the defined types were placed in this encounter.  Medication changes: No orders of the defined types were placed in this encounter.   Signed, Lamar DOROTHA Fitch, MD, [facility] 06/22/2024 2:38 PM    [facility]

## 2024-06-23 ENCOUNTER — Other Ambulatory Visit: Payer: Self-pay

## 2024-06-23 ENCOUNTER — Ambulatory Visit
Admission: RE | Admit: 2024-06-23 | Discharge: 2024-06-23 | Disposition: A | Discharge status: Home / Self Care | Source: Ambulatory Visit | Attending: Radiation Oncology | Admitting: Radiation Oncology

## 2024-06-23 DIAGNOSIS — Z510 Encounter for antineoplastic radiation therapy: Secondary | ICD-10-CM | POA: Diagnosis not present

## 2024-06-23 LAB — RAD ONC ARIA SESSION SUMMARY
Course Elapsed Days: 6
Plan Fractions Treated to Date: 5
Plan Prescribed Dose Per Fraction: 2 Gy
Plan Total Fractions Prescribed: 23
Plan Total Prescribed Dose: 46 Gy
Reference Point Dosage Given to Date: 10 Gy
Reference Point Session Dosage Given: 2 Gy
Session Number: 5

## 2024-06-24 ENCOUNTER — Ambulatory Visit
Admission: RE | Admit: 2024-06-24 | Discharge: 2024-06-24 | Disposition: A | Discharge status: Home / Self Care | Source: Ambulatory Visit | Attending: Radiation Oncology | Admitting: Radiation Oncology

## 2024-06-24 ENCOUNTER — Other Ambulatory Visit: Payer: Self-pay

## 2024-06-24 DIAGNOSIS — Z510 Encounter for antineoplastic radiation therapy: Secondary | ICD-10-CM | POA: Diagnosis not present

## 2024-06-24 LAB — RAD ONC ARIA SESSION SUMMARY
Course Elapsed Days: 7
Plan Fractions Treated to Date: 6
Plan Prescribed Dose Per Fraction: 2 Gy
Plan Total Fractions Prescribed: 23
Plan Total Prescribed Dose: 46 Gy
Reference Point Dosage Given to Date: 12 Gy
Reference Point Session Dosage Given: 2 Gy
Session Number: 6

## 2024-06-25 ENCOUNTER — Ambulatory Visit
Admission: RE | Admit: 2024-06-25 | Discharge: 2024-06-25 | Disposition: A | Discharge status: Home / Self Care | Source: Ambulatory Visit | Attending: Radiation Oncology | Admitting: Radiation Oncology

## 2024-06-25 ENCOUNTER — Other Ambulatory Visit: Payer: Self-pay

## 2024-06-25 ENCOUNTER — Ambulatory Visit: Attending: Cardiology

## 2024-06-25 DIAGNOSIS — R0600 Dyspnea, unspecified: Secondary | ICD-10-CM

## 2024-06-25 DIAGNOSIS — Z510 Encounter for antineoplastic radiation therapy: Secondary | ICD-10-CM | POA: Diagnosis not present

## 2024-06-25 DIAGNOSIS — R0609 Other forms of dyspnea: Secondary | ICD-10-CM

## 2024-06-25 LAB — RAD ONC ARIA SESSION SUMMARY
Course Elapsed Days: 8
Plan Fractions Treated to Date: 7
Plan Prescribed Dose Per Fraction: 2 Gy
Plan Total Fractions Prescribed: 23
Plan Total Prescribed Dose: 46 Gy
Reference Point Dosage Given to Date: 14 Gy
Reference Point Session Dosage Given: 2 Gy
Session Number: 7

## 2024-06-26 ENCOUNTER — Other Ambulatory Visit: Payer: Self-pay

## 2024-06-26 ENCOUNTER — Ambulatory Visit
Admission: RE | Admit: 2024-06-26 | Discharge: 2024-06-26 | Disposition: A | Discharge status: Home / Self Care | Source: Ambulatory Visit | Attending: Radiation Oncology | Admitting: Radiation Oncology

## 2024-06-26 DIAGNOSIS — Z510 Encounter for antineoplastic radiation therapy: Secondary | ICD-10-CM | POA: Diagnosis not present

## 2024-06-26 LAB — RAD ONC ARIA SESSION SUMMARY
Course Elapsed Days: 9
Plan Fractions Treated to Date: 8
Plan Prescribed Dose Per Fraction: 2 Gy
Plan Total Fractions Prescribed: 23
Plan Total Prescribed Dose: 46 Gy
Reference Point Dosage Given to Date: 16 Gy
Reference Point Session Dosage Given: 2 Gy
Session Number: 8

## 2024-06-26 LAB — ECHOCARDIOGRAM COMPLETE
Area-P 1/2: 2.93 cm2
S' Lateral: 2.5 cm

## 2024-06-29 ENCOUNTER — Ambulatory Visit
Admission: RE | Admit: 2024-06-29 | Discharge: 2024-06-29 | Disposition: A | Discharge status: Home / Self Care | Source: Ambulatory Visit | Attending: Radiation Oncology

## 2024-06-29 ENCOUNTER — Ambulatory Visit
Admission: RE | Admit: 2024-06-29 | Discharge: 2024-06-29 | Disposition: A | Discharge status: Home / Self Care | Source: Ambulatory Visit | Attending: Radiation Oncology | Admitting: Radiation Oncology

## 2024-06-29 ENCOUNTER — Other Ambulatory Visit: Payer: Self-pay

## 2024-06-29 DIAGNOSIS — Z510 Encounter for antineoplastic radiation therapy: Secondary | ICD-10-CM | POA: Diagnosis not present

## 2024-06-29 LAB — RAD ONC ARIA SESSION SUMMARY
Course Elapsed Days: 12
Plan Fractions Treated to Date: 9
Plan Prescribed Dose Per Fraction: 2 Gy
Plan Total Fractions Prescribed: 23
Plan Total Prescribed Dose: 46 Gy
Reference Point Dosage Given to Date: 18 Gy
Reference Point Session Dosage Given: 2 Gy
Session Number: 9

## 2024-06-30 ENCOUNTER — Telehealth: Payer: Self-pay

## 2024-06-30 ENCOUNTER — Ambulatory Visit: Admitting: Radiation Oncology

## 2024-06-30 ENCOUNTER — Ambulatory Visit
Admission: RE | Admit: 2024-06-30 | Discharge: 2024-06-30 | Disposition: A | Discharge status: Home / Self Care | Source: Ambulatory Visit | Attending: Radiation Oncology | Admitting: Radiation Oncology

## 2024-06-30 ENCOUNTER — Other Ambulatory Visit: Payer: Self-pay

## 2024-06-30 DIAGNOSIS — Z510 Encounter for antineoplastic radiation therapy: Secondary | ICD-10-CM | POA: Diagnosis not present

## 2024-06-30 LAB — RAD ONC ARIA SESSION SUMMARY
Course Elapsed Days: 13
Plan Fractions Treated to Date: 10
Plan Prescribed Dose Per Fraction: 2 Gy
Plan Total Fractions Prescribed: 23
Plan Total Prescribed Dose: 46 Gy
Reference Point Dosage Given to Date: 20 Gy
Reference Point Session Dosage Given: 2 Gy
Session Number: 10

## 2024-06-30 NOTE — Telephone Encounter (Signed)
 Left message on My Chart with Echo results per Dr. Karry note. Routed to PCP.

## 2024-07-01 ENCOUNTER — Ambulatory Visit
Admission: RE | Admit: 2024-07-01 | Discharge: 2024-07-01 | Disposition: A | Discharge status: Home / Self Care | Source: Ambulatory Visit | Attending: Radiation Oncology

## 2024-07-01 ENCOUNTER — Other Ambulatory Visit: Payer: Self-pay

## 2024-07-01 DIAGNOSIS — Z510 Encounter for antineoplastic radiation therapy: Secondary | ICD-10-CM | POA: Diagnosis not present

## 2024-07-01 LAB — RAD ONC ARIA SESSION SUMMARY
Course Elapsed Days: 14
Plan Fractions Treated to Date: 11
Plan Prescribed Dose Per Fraction: 2 Gy
Plan Total Fractions Prescribed: 23
Plan Total Prescribed Dose: 46 Gy
Reference Point Dosage Given to Date: 22 Gy
Reference Point Session Dosage Given: 2 Gy
Session Number: 11

## 2024-07-02 ENCOUNTER — Other Ambulatory Visit: Payer: Self-pay

## 2024-07-02 ENCOUNTER — Telehealth: Payer: Self-pay

## 2024-07-02 ENCOUNTER — Ambulatory Visit
Admission: RE | Admit: 2024-07-02 | Discharge: 2024-07-02 | Disposition: A | Discharge status: Home / Self Care | Source: Ambulatory Visit | Attending: Radiation Oncology

## 2024-07-02 DIAGNOSIS — Z510 Encounter for antineoplastic radiation therapy: Secondary | ICD-10-CM | POA: Diagnosis not present

## 2024-07-02 LAB — RAD ONC ARIA SESSION SUMMARY
Course Elapsed Days: 15
Plan Fractions Treated to Date: 12
Plan Prescribed Dose Per Fraction: 2 Gy
Plan Total Fractions Prescribed: 23
Plan Total Prescribed Dose: 46 Gy
Reference Point Dosage Given to Date: 24 Gy
Reference Point Session Dosage Given: 2 Gy
Session Number: 12

## 2024-07-02 NOTE — Telephone Encounter (Signed)
 Pt viewed Echo results on My Chart per Dr. Vanetta Shawl note. Routed to PCP.

## 2024-07-03 ENCOUNTER — Ambulatory Visit
Admission: RE | Admit: 2024-07-03 | Discharge: 2024-07-03 | Disposition: A | Discharge status: Home / Self Care | Source: Ambulatory Visit | Attending: Radiation Oncology | Admitting: Radiation Oncology

## 2024-07-03 ENCOUNTER — Other Ambulatory Visit: Payer: Self-pay

## 2024-07-03 DIAGNOSIS — Z510 Encounter for antineoplastic radiation therapy: Secondary | ICD-10-CM | POA: Diagnosis not present

## 2024-07-03 LAB — RAD ONC ARIA SESSION SUMMARY
Course Elapsed Days: 16
Plan Fractions Treated to Date: 13
Plan Prescribed Dose Per Fraction: 2 Gy
Plan Total Fractions Prescribed: 23
Plan Total Prescribed Dose: 46 Gy
Reference Point Dosage Given to Date: 26 Gy
Reference Point Session Dosage Given: 2 Gy
Session Number: 13

## 2024-07-06 ENCOUNTER — Ambulatory Visit
Admission: RE | Admit: 2024-07-06 | Discharge: 2024-07-06 | Disposition: A | Discharge status: Home / Self Care | Source: Ambulatory Visit | Attending: Radiation Oncology | Admitting: Radiation Oncology

## 2024-07-06 ENCOUNTER — Other Ambulatory Visit: Payer: Self-pay

## 2024-07-06 DIAGNOSIS — Z510 Encounter for antineoplastic radiation therapy: Secondary | ICD-10-CM | POA: Diagnosis not present

## 2024-07-06 LAB — RAD ONC ARIA SESSION SUMMARY
Course Elapsed Days: 19
Plan Fractions Treated to Date: 14
Plan Prescribed Dose Per Fraction: 2 Gy
Plan Total Fractions Prescribed: 23
Plan Total Prescribed Dose: 46 Gy
Reference Point Dosage Given to Date: 28 Gy
Reference Point Session Dosage Given: 2 Gy
Session Number: 14

## 2024-07-07 ENCOUNTER — Ambulatory Visit
Admission: RE | Admit: 2024-07-07 | Discharge: 2024-07-07 | Disposition: A | Discharge status: Home / Self Care | Source: Ambulatory Visit | Attending: Radiation Oncology | Admitting: Radiation Oncology

## 2024-07-07 ENCOUNTER — Other Ambulatory Visit: Payer: Self-pay

## 2024-07-07 DIAGNOSIS — Z510 Encounter for antineoplastic radiation therapy: Secondary | ICD-10-CM | POA: Diagnosis not present

## 2024-07-07 DIAGNOSIS — C61 Malignant neoplasm of prostate: Secondary | ICD-10-CM

## 2024-07-07 LAB — RAD ONC ARIA SESSION SUMMARY
Course Elapsed Days: 20
Plan Fractions Treated to Date: 15
Plan Prescribed Dose Per Fraction: 2 Gy
Plan Total Fractions Prescribed: 23
Plan Total Prescribed Dose: 46 Gy
Reference Point Dosage Given to Date: 30 Gy
Reference Point Session Dosage Given: 2 Gy
Session Number: 15

## 2024-07-07 MED ORDER — FINASTERIDE 5 MG PO TABS: 5.0000 mg | ORAL_TABLET | Freq: Every day | ORAL | 1 refills | Status: AC

## 2024-07-08 ENCOUNTER — Ambulatory Visit
Admission: RE | Admit: 2024-07-08 | Discharge: 2024-07-08 | Disposition: A | Discharge status: Home / Self Care | Source: Ambulatory Visit | Attending: Radiation Oncology

## 2024-07-08 ENCOUNTER — Other Ambulatory Visit: Payer: Self-pay

## 2024-07-08 DIAGNOSIS — Z510 Encounter for antineoplastic radiation therapy: Secondary | ICD-10-CM | POA: Diagnosis not present

## 2024-07-08 LAB — RAD ONC ARIA SESSION SUMMARY
Course Elapsed Days: 21
Plan Fractions Treated to Date: 16
Plan Prescribed Dose Per Fraction: 2 Gy
Plan Total Fractions Prescribed: 23
Plan Total Prescribed Dose: 46 Gy
Reference Point Dosage Given to Date: 32 Gy
Reference Point Session Dosage Given: 2 Gy
Session Number: 16

## 2024-07-09 ENCOUNTER — Other Ambulatory Visit: Payer: Self-pay

## 2024-07-09 ENCOUNTER — Ambulatory Visit
Admission: RE | Admit: 2024-07-09 | Discharge: 2024-07-09 | Disposition: A | Discharge status: Home / Self Care | Source: Ambulatory Visit | Attending: Radiation Oncology | Admitting: Radiation Oncology

## 2024-07-09 DIAGNOSIS — Z510 Encounter for antineoplastic radiation therapy: Secondary | ICD-10-CM | POA: Diagnosis not present

## 2024-07-09 LAB — RAD ONC ARIA SESSION SUMMARY
Course Elapsed Days: 22
Plan Fractions Treated to Date: 17
Plan Prescribed Dose Per Fraction: 2 Gy
Plan Total Fractions Prescribed: 23
Plan Total Prescribed Dose: 46 Gy
Reference Point Dosage Given to Date: 34 Gy
Reference Point Session Dosage Given: 2 Gy
Session Number: 17

## 2024-07-09 NOTE — Progress Notes (Signed)
 Patient remains under active care with Dr. Jomarie for his radiation.   Last XRT is scheduled for 9/30.  No needs at this time.  Will follow up post radiation.

## 2024-07-10 ENCOUNTER — Other Ambulatory Visit: Payer: Self-pay

## 2024-07-10 ENCOUNTER — Ambulatory Visit
Admission: RE | Admit: 2024-07-10 | Discharge: 2024-07-10 | Disposition: A | Discharge status: Home / Self Care | Source: Ambulatory Visit | Attending: Radiation Oncology | Admitting: Radiation Oncology

## 2024-07-10 DIAGNOSIS — Z510 Encounter for antineoplastic radiation therapy: Secondary | ICD-10-CM | POA: Diagnosis not present

## 2024-07-10 LAB — RAD ONC ARIA SESSION SUMMARY
Course Elapsed Days: 23
Plan Fractions Treated to Date: 18
Plan Prescribed Dose Per Fraction: 2 Gy
Plan Total Fractions Prescribed: 23
Plan Total Prescribed Dose: 46 Gy
Reference Point Dosage Given to Date: 36 Gy
Reference Point Session Dosage Given: 2 Gy
Session Number: 18

## 2024-07-14 ENCOUNTER — Other Ambulatory Visit: Payer: Self-pay

## 2024-07-14 ENCOUNTER — Ambulatory Visit
Admission: RE | Admit: 2024-07-14 | Discharge: 2024-07-14 | Disposition: A | Discharge status: Home / Self Care | Source: Ambulatory Visit | Attending: Radiation Oncology | Admitting: Radiation Oncology

## 2024-07-14 DIAGNOSIS — Z510 Encounter for antineoplastic radiation therapy: Secondary | ICD-10-CM | POA: Diagnosis present

## 2024-07-14 DIAGNOSIS — C61 Malignant neoplasm of prostate: Secondary | ICD-10-CM | POA: Diagnosis present

## 2024-07-14 LAB — RAD ONC ARIA SESSION SUMMARY
Course Elapsed Days: 27
Plan Fractions Treated to Date: 19
Plan Prescribed Dose Per Fraction: 2 Gy
Plan Total Fractions Prescribed: 23
Plan Total Prescribed Dose: 46 Gy
Reference Point Dosage Given to Date: 38 Gy
Reference Point Session Dosage Given: 2 Gy
Session Number: 19

## 2024-07-15 ENCOUNTER — Ambulatory Visit
Admission: RE | Admit: 2024-07-15 | Discharge: 2024-07-15 | Disposition: A | Discharge status: Home / Self Care | Source: Ambulatory Visit | Attending: Radiation Oncology

## 2024-07-15 ENCOUNTER — Other Ambulatory Visit: Payer: Self-pay

## 2024-07-15 DIAGNOSIS — Z510 Encounter for antineoplastic radiation therapy: Secondary | ICD-10-CM | POA: Diagnosis not present

## 2024-07-15 LAB — RAD ONC ARIA SESSION SUMMARY
Course Elapsed Days: 28
Plan Fractions Treated to Date: 20
Plan Prescribed Dose Per Fraction: 2 Gy
Plan Total Fractions Prescribed: 23
Plan Total Prescribed Dose: 46 Gy
Reference Point Dosage Given to Date: 40 Gy
Reference Point Session Dosage Given: 2 Gy
Session Number: 20

## 2024-07-16 ENCOUNTER — Other Ambulatory Visit: Payer: Self-pay

## 2024-07-16 ENCOUNTER — Ambulatory Visit
Admission: RE | Admit: 2024-07-16 | Discharge: 2024-07-16 | Disposition: A | Discharge status: Home / Self Care | Source: Ambulatory Visit | Attending: Radiation Oncology

## 2024-07-16 DIAGNOSIS — Z510 Encounter for antineoplastic radiation therapy: Secondary | ICD-10-CM | POA: Diagnosis not present

## 2024-07-16 LAB — RAD ONC ARIA SESSION SUMMARY
Course Elapsed Days: 29
Plan Fractions Treated to Date: 21
Plan Prescribed Dose Per Fraction: 2 Gy
Plan Total Fractions Prescribed: 23
Plan Total Prescribed Dose: 46 Gy
Reference Point Dosage Given to Date: 42 Gy
Reference Point Session Dosage Given: 2 Gy
Session Number: 21

## 2024-07-17 ENCOUNTER — Other Ambulatory Visit: Payer: Self-pay

## 2024-07-17 ENCOUNTER — Ambulatory Visit
Admission: RE | Admit: 2024-07-17 | Discharge: 2024-07-17 | Disposition: A | Discharge status: Home / Self Care | Source: Ambulatory Visit | Attending: Radiation Oncology | Admitting: Radiation Oncology

## 2024-07-17 DIAGNOSIS — Z510 Encounter for antineoplastic radiation therapy: Secondary | ICD-10-CM | POA: Diagnosis not present

## 2024-07-17 LAB — RAD ONC ARIA SESSION SUMMARY
Course Elapsed Days: 30
Plan Fractions Treated to Date: 22
Plan Prescribed Dose Per Fraction: 2 Gy
Plan Total Fractions Prescribed: 23
Plan Total Prescribed Dose: 46 Gy
Reference Point Dosage Given to Date: 44 Gy
Reference Point Session Dosage Given: 2 Gy
Session Number: 22

## 2024-07-20 ENCOUNTER — Other Ambulatory Visit: Payer: Self-pay

## 2024-07-20 ENCOUNTER — Ambulatory Visit
Admission: RE | Admit: 2024-07-20 | Discharge: 2024-07-20 | Disposition: A | Discharge status: Home / Self Care | Source: Ambulatory Visit | Attending: Radiation Oncology

## 2024-07-20 DIAGNOSIS — Z510 Encounter for antineoplastic radiation therapy: Secondary | ICD-10-CM | POA: Diagnosis not present

## 2024-07-20 LAB — RAD ONC ARIA SESSION SUMMARY
Course Elapsed Days: 33
Plan Fractions Treated to Date: 23
Plan Prescribed Dose Per Fraction: 2 Gy
Plan Total Fractions Prescribed: 23
Plan Total Prescribed Dose: 46 Gy
Reference Point Dosage Given to Date: 46 Gy
Reference Point Session Dosage Given: 2 Gy
Session Number: 23

## 2024-07-21 ENCOUNTER — Other Ambulatory Visit: Payer: Self-pay

## 2024-07-21 ENCOUNTER — Ambulatory Visit
Admission: RE | Admit: 2024-07-21 | Discharge: 2024-07-21 | Disposition: A | Discharge status: Home / Self Care | Source: Ambulatory Visit | Attending: Radiation Oncology | Admitting: Radiation Oncology

## 2024-07-21 DIAGNOSIS — Z510 Encounter for antineoplastic radiation therapy: Secondary | ICD-10-CM | POA: Diagnosis not present

## 2024-07-21 LAB — RAD ONC ARIA SESSION SUMMARY
Course Elapsed Days: 34
Plan Fractions Treated to Date: 1
Plan Prescribed Dose Per Fraction: 2 Gy
Plan Total Fractions Prescribed: 15
Plan Total Prescribed Dose: 30 Gy
Reference Point Dosage Given to Date: 2 Gy
Reference Point Session Dosage Given: 2 Gy
Session Number: 24

## 2024-07-22 ENCOUNTER — Other Ambulatory Visit: Payer: Self-pay

## 2024-07-22 ENCOUNTER — Ambulatory Visit
Admission: RE | Admit: 2024-07-22 | Discharge: 2024-07-22 | Disposition: A | Discharge status: Home / Self Care | Source: Ambulatory Visit | Attending: Radiation Oncology | Admitting: Radiation Oncology

## 2024-07-22 DIAGNOSIS — Z510 Encounter for antineoplastic radiation therapy: Secondary | ICD-10-CM | POA: Diagnosis not present

## 2024-07-22 LAB — RAD ONC ARIA SESSION SUMMARY
Course Elapsed Days: 35
Plan Fractions Treated to Date: 2
Plan Prescribed Dose Per Fraction: 2 Gy
Plan Total Fractions Prescribed: 15
Plan Total Prescribed Dose: 30 Gy
Reference Point Dosage Given to Date: 4 Gy
Reference Point Session Dosage Given: 2 Gy
Session Number: 25

## 2024-07-23 ENCOUNTER — Ambulatory Visit
Admission: RE | Admit: 2024-07-23 | Discharge: 2024-07-23 | Disposition: A | Discharge status: Home / Self Care | Source: Ambulatory Visit | Attending: Radiation Oncology | Admitting: Radiation Oncology

## 2024-07-23 ENCOUNTER — Other Ambulatory Visit: Payer: Self-pay

## 2024-07-23 DIAGNOSIS — Z510 Encounter for antineoplastic radiation therapy: Secondary | ICD-10-CM | POA: Diagnosis not present

## 2024-07-23 LAB — RAD ONC ARIA SESSION SUMMARY
Course Elapsed Days: 36
Plan Fractions Treated to Date: 3
Plan Prescribed Dose Per Fraction: 2 Gy
Plan Total Fractions Prescribed: 15
Plan Total Prescribed Dose: 30 Gy
Reference Point Dosage Given to Date: 6 Gy
Reference Point Session Dosage Given: 2 Gy
Session Number: 26

## 2024-07-24 ENCOUNTER — Other Ambulatory Visit: Payer: Self-pay

## 2024-07-24 ENCOUNTER — Ambulatory Visit
Admission: RE | Admit: 2024-07-24 | Discharge: 2024-07-24 | Disposition: A | Discharge status: Home / Self Care | Source: Ambulatory Visit | Attending: Radiation Oncology | Admitting: Radiation Oncology

## 2024-07-24 DIAGNOSIS — Z510 Encounter for antineoplastic radiation therapy: Secondary | ICD-10-CM | POA: Diagnosis not present

## 2024-07-24 LAB — RAD ONC ARIA SESSION SUMMARY
Course Elapsed Days: 37
Plan Fractions Treated to Date: 4
Plan Prescribed Dose Per Fraction: 2 Gy
Plan Total Fractions Prescribed: 15
Plan Total Prescribed Dose: 30 Gy
Reference Point Dosage Given to Date: 8 Gy
Reference Point Session Dosage Given: 2 Gy
Session Number: 27

## 2024-07-27 ENCOUNTER — Ambulatory Visit
Admission: RE | Admit: 2024-07-27 | Discharge: 2024-07-27 | Disposition: A | Discharge status: Home / Self Care | Source: Ambulatory Visit | Attending: Radiation Oncology | Admitting: Radiation Oncology

## 2024-07-27 ENCOUNTER — Other Ambulatory Visit: Payer: Self-pay

## 2024-07-27 DIAGNOSIS — Z510 Encounter for antineoplastic radiation therapy: Secondary | ICD-10-CM | POA: Diagnosis not present

## 2024-07-27 LAB — RAD ONC ARIA SESSION SUMMARY
Course Elapsed Days: 40
Plan Fractions Treated to Date: 5
Plan Prescribed Dose Per Fraction: 2 Gy
Plan Total Fractions Prescribed: 15
Plan Total Prescribed Dose: 30 Gy
Reference Point Dosage Given to Date: 10 Gy
Reference Point Session Dosage Given: 2 Gy
Session Number: 28

## 2024-07-28 ENCOUNTER — Ambulatory Visit
Admission: RE | Admit: 2024-07-28 | Discharge: 2024-07-28 | Disposition: A | Discharge status: Home / Self Care | Source: Ambulatory Visit | Attending: Radiation Oncology

## 2024-07-28 ENCOUNTER — Other Ambulatory Visit: Payer: Self-pay

## 2024-07-28 ENCOUNTER — Ambulatory Visit
Admission: RE | Admit: 2024-07-28 | Discharge: 2024-07-28 | Disposition: A | Discharge status: Home / Self Care | Source: Ambulatory Visit | Attending: Radiation Oncology | Admitting: Radiation Oncology

## 2024-07-28 DIAGNOSIS — Z510 Encounter for antineoplastic radiation therapy: Secondary | ICD-10-CM | POA: Diagnosis not present

## 2024-07-28 LAB — RAD ONC ARIA SESSION SUMMARY
Course Elapsed Days: 41
Plan Fractions Treated to Date: 6
Plan Prescribed Dose Per Fraction: 2 Gy
Plan Total Fractions Prescribed: 15
Plan Total Prescribed Dose: 30 Gy
Reference Point Dosage Given to Date: 12 Gy
Reference Point Session Dosage Given: 2 Gy
Session Number: 29

## 2024-07-29 ENCOUNTER — Ambulatory Visit
Admission: RE | Admit: 2024-07-29 | Discharge: 2024-07-29 | Disposition: A | Discharge status: Home / Self Care | Source: Ambulatory Visit | Attending: Radiation Oncology

## 2024-07-29 ENCOUNTER — Other Ambulatory Visit: Payer: Self-pay

## 2024-07-29 DIAGNOSIS — Z510 Encounter for antineoplastic radiation therapy: Secondary | ICD-10-CM | POA: Diagnosis not present

## 2024-07-29 LAB — RAD ONC ARIA SESSION SUMMARY
Course Elapsed Days: 42
Plan Fractions Treated to Date: 7
Plan Prescribed Dose Per Fraction: 2 Gy
Plan Total Fractions Prescribed: 15
Plan Total Prescribed Dose: 30 Gy
Reference Point Dosage Given to Date: 14 Gy
Reference Point Session Dosage Given: 2 Gy
Session Number: 30

## 2024-07-30 ENCOUNTER — Ambulatory Visit
Admission: RE | Admit: 2024-07-30 | Discharge: 2024-07-30 | Disposition: A | Discharge status: Home / Self Care | Source: Ambulatory Visit | Attending: Radiation Oncology

## 2024-07-30 ENCOUNTER — Other Ambulatory Visit: Payer: Self-pay

## 2024-07-30 DIAGNOSIS — Z510 Encounter for antineoplastic radiation therapy: Secondary | ICD-10-CM | POA: Diagnosis not present

## 2024-07-30 LAB — RAD ONC ARIA SESSION SUMMARY
Course Elapsed Days: 43
Plan Fractions Treated to Date: 8
Plan Prescribed Dose Per Fraction: 2 Gy
Plan Total Fractions Prescribed: 15
Plan Total Prescribed Dose: 30 Gy
Reference Point Dosage Given to Date: 16 Gy
Reference Point Session Dosage Given: 2 Gy
Session Number: 31

## 2024-07-31 ENCOUNTER — Ambulatory Visit
Admission: RE | Admit: 2024-07-31 | Discharge: 2024-07-31 | Disposition: A | Discharge status: Home / Self Care | Source: Ambulatory Visit | Attending: Radiation Oncology | Admitting: Radiation Oncology

## 2024-07-31 ENCOUNTER — Other Ambulatory Visit: Payer: Self-pay

## 2024-07-31 DIAGNOSIS — Z510 Encounter for antineoplastic radiation therapy: Secondary | ICD-10-CM | POA: Diagnosis not present

## 2024-07-31 LAB — RAD ONC ARIA SESSION SUMMARY
Course Elapsed Days: 44
Plan Fractions Treated to Date: 9
Plan Prescribed Dose Per Fraction: 2 Gy
Plan Total Fractions Prescribed: 15
Plan Total Prescribed Dose: 30 Gy
Reference Point Dosage Given to Date: 18 Gy
Reference Point Session Dosage Given: 2 Gy
Session Number: 32

## 2024-08-03 ENCOUNTER — Ambulatory Visit
Admission: RE | Admit: 2024-08-03 | Discharge: 2024-08-03 | Disposition: A | Discharge status: Home / Self Care | Source: Ambulatory Visit | Attending: Radiation Oncology

## 2024-08-03 ENCOUNTER — Other Ambulatory Visit: Payer: Self-pay

## 2024-08-03 DIAGNOSIS — Z510 Encounter for antineoplastic radiation therapy: Secondary | ICD-10-CM | POA: Diagnosis not present

## 2024-08-03 LAB — RAD ONC ARIA SESSION SUMMARY
Course Elapsed Days: 47
Plan Fractions Treated to Date: 10
Plan Prescribed Dose Per Fraction: 2 Gy
Plan Total Fractions Prescribed: 15
Plan Total Prescribed Dose: 30 Gy
Reference Point Dosage Given to Date: 20 Gy
Reference Point Session Dosage Given: 2 Gy
Session Number: 33

## 2024-08-04 ENCOUNTER — Ambulatory Visit
Admission: RE | Admit: 2024-08-04 | Discharge: 2024-08-04 | Disposition: A | Discharge status: Home / Self Care | Source: Ambulatory Visit | Attending: Radiation Oncology | Admitting: Radiation Oncology

## 2024-08-04 ENCOUNTER — Ambulatory Visit
Admission: RE | Admit: 2024-08-04 | Discharge: 2024-08-04 | Disposition: A | Discharge status: Home / Self Care | Source: Ambulatory Visit | Attending: Radiation Oncology

## 2024-08-04 ENCOUNTER — Other Ambulatory Visit: Payer: Self-pay

## 2024-08-04 DIAGNOSIS — Z510 Encounter for antineoplastic radiation therapy: Secondary | ICD-10-CM | POA: Diagnosis not present

## 2024-08-04 LAB — RAD ONC ARIA SESSION SUMMARY
Course Elapsed Days: 48
Plan Fractions Treated to Date: 11
Plan Prescribed Dose Per Fraction: 2 Gy
Plan Total Fractions Prescribed: 15
Plan Total Prescribed Dose: 30 Gy
Reference Point Dosage Given to Date: 22 Gy
Reference Point Session Dosage Given: 2 Gy
Session Number: 34

## 2024-08-05 ENCOUNTER — Other Ambulatory Visit: Payer: Self-pay

## 2024-08-05 ENCOUNTER — Ambulatory Visit
Admission: RE | Admit: 2024-08-05 | Discharge: 2024-08-05 | Disposition: A | Discharge status: Home / Self Care | Source: Ambulatory Visit | Attending: Radiation Oncology

## 2024-08-05 DIAGNOSIS — Z510 Encounter for antineoplastic radiation therapy: Secondary | ICD-10-CM | POA: Diagnosis not present

## 2024-08-05 LAB — RAD ONC ARIA SESSION SUMMARY
Course Elapsed Days: 49
Plan Fractions Treated to Date: 12
Plan Prescribed Dose Per Fraction: 2 Gy
Plan Total Fractions Prescribed: 15
Plan Total Prescribed Dose: 30 Gy
Reference Point Dosage Given to Date: 24 Gy
Reference Point Session Dosage Given: 2 Gy
Session Number: 35

## 2024-08-06 ENCOUNTER — Other Ambulatory Visit: Payer: Self-pay

## 2024-08-06 ENCOUNTER — Ambulatory Visit
Admission: RE | Admit: 2024-08-06 | Discharge: 2024-08-06 | Disposition: A | Discharge status: Home / Self Care | Source: Ambulatory Visit | Attending: Radiation Oncology

## 2024-08-06 DIAGNOSIS — Z510 Encounter for antineoplastic radiation therapy: Secondary | ICD-10-CM | POA: Diagnosis not present

## 2024-08-06 LAB — RAD ONC ARIA SESSION SUMMARY
Course Elapsed Days: 50
Plan Fractions Treated to Date: 13
Plan Prescribed Dose Per Fraction: 2 Gy
Plan Total Fractions Prescribed: 15
Plan Total Prescribed Dose: 30 Gy
Reference Point Dosage Given to Date: 26 Gy
Reference Point Session Dosage Given: 2 Gy
Session Number: 36

## 2024-08-07 ENCOUNTER — Ambulatory Visit: Admitting: Radiation Oncology

## 2024-08-07 ENCOUNTER — Ambulatory Visit

## 2024-08-10 ENCOUNTER — Other Ambulatory Visit: Payer: Self-pay

## 2024-08-10 ENCOUNTER — Ambulatory Visit

## 2024-08-10 ENCOUNTER — Ambulatory Visit
Admission: RE | Admit: 2024-08-10 | Discharge: 2024-08-10 | Disposition: A | Source: Ambulatory Visit | Attending: Radiation Oncology

## 2024-08-10 DIAGNOSIS — Z510 Encounter for antineoplastic radiation therapy: Secondary | ICD-10-CM | POA: Diagnosis not present

## 2024-08-10 LAB — RAD ONC ARIA SESSION SUMMARY
Course Elapsed Days: 54
Plan Fractions Treated to Date: 14
Plan Prescribed Dose Per Fraction: 2 Gy
Plan Total Fractions Prescribed: 15
Plan Total Prescribed Dose: 30 Gy
Reference Point Dosage Given to Date: 28 Gy
Reference Point Session Dosage Given: 2 Gy
Session Number: 37

## 2024-08-11 ENCOUNTER — Ambulatory Visit
Admission: RE | Admit: 2024-08-11 | Discharge: 2024-08-11 | Disposition: A | Discharge status: Home / Self Care | Source: Ambulatory Visit | Attending: Radiation Oncology | Admitting: Radiation Oncology

## 2024-08-11 ENCOUNTER — Ambulatory Visit
Admission: RE | Admit: 2024-08-11 | Discharge: 2024-08-11 | Disposition: A | Source: Ambulatory Visit | Attending: Radiation Oncology | Admitting: Radiation Oncology

## 2024-08-11 ENCOUNTER — Other Ambulatory Visit: Payer: Self-pay

## 2024-08-11 DIAGNOSIS — Z510 Encounter for antineoplastic radiation therapy: Secondary | ICD-10-CM | POA: Diagnosis not present

## 2024-08-11 LAB — RAD ONC ARIA SESSION SUMMARY
Course Elapsed Days: 55
Plan Fractions Treated to Date: 15
Plan Prescribed Dose Per Fraction: 2 Gy
Plan Total Fractions Prescribed: 15
Plan Total Prescribed Dose: 30 Gy
Reference Point Dosage Given to Date: 30 Gy
Reference Point Session Dosage Given: 2 Gy
Session Number: 38

## 2024-08-12 NOTE — Radiation Completion Notes (Signed)
 Patient Name: Brad Hamilton, Brad Hamilton MRN: [id number] Date of Birth: 28-Nov-1945 Referring Physician: LAMAR HOBBY, M.D. Date of Service: 2024-08-12 Radiation Oncologist: Lynwood Cedar, M.D. [facility]                             RADIATION ONCOLOGY END OF TREATMENT NOTE     Diagnosis: C61 Malignant neoplasm of prostate Staging on 2024-01-31: Malignant neoplasm of prostate (HCC) T=cT2a, N=cN0, M=cM0 Intent: Curative     ==========DELIVERED PLANS==========  First Treatment Date: 2024-06-17 Last Treatment Date: 2024-08-11   Plan Name: Prostate_Pel Site: Prostate Technique: IMRT Mode: Photon Dose Per Fraction: 2 Gy Prescribed Dose (Delivered / Prescribed): 46 Gy / 46 Gy Prescribed Fxs (Delivered / Prescribed): 23 / 23   Plan Name: Prostate_Bst Site: Prostate Technique: IMRT Mode: Photon Dose Per Fraction: 2 Gy Prescribed Dose (Delivered / Prescribed): 30 Gy / 30 Gy Prescribed Fxs (Delivered / Prescribed): 15 / 15     ==========ON TREATMENT VISIT DATES========== 2024-06-23, 2024-06-29, 2024-07-07, 2024-07-14, 2024-07-21, 2024-07-28, 2024-08-04, 2024-08-11     ==========UPCOMING VISITS========== 09/18/2024 [facility] RAD ONC FOLLOW UP 20 Cedar Lynwood, MD        ==========APPENDIX - ON TREATMENT VISIT NOTES==========   See weekly On Treatment Notes in Epic for details in the Media tab (listed as Progress notes on the On Treatment Visit Dates listed above).

## 2024-08-31 NOTE — Progress Notes (Signed)
 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  Patient proceed with treatment recommendations of 8 weeks of external beam therapy concurrent with ADT and had his final radiation treatment on 08/11/24 in [place] with Dr. Jomarie.   RN has requested for follow up with urology, Dr. Wrenn for post treatment PSA monitoring.  Staff will review with Dr. Watt for recommendations and contact patient.  Will continue to follow to ensure patient is scheduled appropriately.

## 2024-09-10 NOTE — Progress Notes (Signed)
 RN followed up with [facility] for coordination of follow up's post radiation.  Triage nurse will assign review to Dr. Watt.

## 2024-09-17 NOTE — Progress Notes (Signed)
 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  Patient proceed with treatment recommendations of 8 weeks of external beam therapy concurrent with ADT and had his final radiation treatment on 08/11/2024.   Patient is scheduled for a follow up with Dr. Jomarie, where he received radiation, on 09/18/24 and will follow up with urology on 10/06/24.  RN notified patient.

## 2024-09-18 ENCOUNTER — Ambulatory Visit
Admission: RE | Admit: 2024-09-18 | Discharge: 2024-09-18 | Disposition: A | Discharge status: Home / Self Care | Payer: Self-pay | Source: Ambulatory Visit | Attending: Radiation Oncology | Admitting: Radiation Oncology

## 2024-09-18 VITALS — BP 131/66 | HR 65 | Temp 98.2°F | Resp 18 | Ht 66.0 in | Wt 158.0 lb

## 2024-09-18 DIAGNOSIS — Z79899 Other long term (current) drug therapy: Secondary | ICD-10-CM | POA: Insufficient documentation

## 2024-09-18 DIAGNOSIS — C61 Malignant neoplasm of prostate: Secondary | ICD-10-CM | POA: Insufficient documentation

## 2024-09-18 DIAGNOSIS — Z923 Personal history of irradiation: Secondary | ICD-10-CM | POA: Diagnosis not present

## 2024-09-18 DIAGNOSIS — Z7982 Long term (current) use of aspirin: Secondary | ICD-10-CM | POA: Insufficient documentation

## 2024-09-18 NOTE — Progress Notes (Signed)
 Department of Radiation Oncology    Followup Note    Name: Brad Hamilton Date: 09/18/2024 MRN: [id number] DOB: 02-Aug-1946   Diagnosis:     ICD-10-CM   1. Malignant neoplasm of prostate (HCC)  C61         MEDICATIONS: Current Outpatient Medications  Medication Sig Dispense Refill   tamsulosin (FLOMAX) 0.4 MG CAPS capsule Take 0.4 mg by mouth daily.     aspirin  EC 81 MG tablet Take 81 mg by mouth daily with breakfast.     buPROPion (WELLBUTRIN XL) 150 MG 24 hr tablet Take 150 mg by mouth daily with breakfast.     cloNIDine (CATAPRES) 0.1 MG tablet Take 0.1 mg by mouth daily as needed (Elevated BP).     diltiazem  (CARDIZEM  CD) 120 MG 24 hr capsule Take 1 capsule (120 mg total) by mouth 2 (two) times daily. 180 capsule 3   ezetimibe  (ZETIA ) 10 MG tablet Take 1 tablet (10 mg total) by mouth daily. 90 tablet 3   finasteride  (PROSCAR ) 5 MG tablet Take 1 tablet (5 mg total) by mouth daily. 30 tablet 1   fluticasone (FLONASE) 50 MCG/ACT nasal spray Place 1 spray into both nostrils daily.     ibuprofen (ADVIL,MOTRIN) 200 MG tablet Take 400-600 mg by mouth every 6 (six) hours as needed for mild pain (pain score 1-3) or moderate pain (pain score 4-6). For pain     minoxidil (ROGAINE) 2 % external solution Apply 1 Application topically daily.     Multiple Vitamins-Minerals (PRESERVISION AREDS 2+MULTI VIT) CAPS Take 1 tablet by mouth 2 (two) times daily.     olmesartan (BENICAR) 20 MG tablet Take 1 tablet (20 mg total) by mouth daily. Patient needs appointment for further refills. 1 st attempt 90 tablet 3   ORGOVYX 120 MG tablet Take 120 mg by mouth daily.     OVER THE COUNTER MEDICATION Take 1 tablet by mouth daily with breakfast. Alphaloproic Acid=For immune system     Pitavastatin  Calcium  4 MG TABS Take 1 tablet (4 mg total) by mouth daily. 90 tablet 3   Saw Palmetto 450 MG CAPS Take 450 mg by mouth daily with breakfast.     tretinoin (RETIN-A) 0.05 % cream Apply 1 Application  topically at bedtime.     No current facility-administered medications for this encounter.      NARRATIVE: Brad Hamilton was seen today in followup.  He completed external beam radiation approximately 2 months ago.  This is his first follow-up visit.  He has follow-up scheduled with his urologist, Dr. Watt, later this month.  He continues on Orgovyx.  He states that that his symptoms have improved significantly.  Reports less frequent nocturia.  He denies burning with urination, or urgency.  His energy level is improving.  PHYSICAL EXAMINATION: height is 5' 6 (1.676 m) and weight is 158 lb (71.7 kg). His oral temperature is 98.2 F (36.8 C). His blood pressure is 131/66 and his pulse is 65. His respiration is 18 and oxygen saturation is 98%.      He is in no apparent distress.  ASSESSMENT: The patient is doing well approximately 2 months after completion of external beam radiation for his prostate carcinoma.  His radiation related symptoms have significantly improved.  PLAN: The patient will follow up on an as needed basis, as he will continue to be followed routinely by his urologist, Dr. Watt.  I did encourage him to contact me at anytime with any questions  or concerns.      Shaynah Hund A. Jomarie, MD

## 2024-09-30 ENCOUNTER — Telehealth: Payer: Self-pay

## 2024-09-30 NOTE — Telephone Encounter (Signed)
 Per office protocol, if patient is without any new symptoms or concerns at the time of their virtual visit, he may hold ASA for 7 days prior to procedure. Please resume ASA as soon as possible postprocedure, at the discretion of the surgeon.

## 2024-09-30 NOTE — Telephone Encounter (Signed)
   Pre-operative Risk Assessment    Patient Name: Brad Hamilton  DOB: [birth year] MRN: [id number]   Date of last office visit: 06/22/2024 Date of next office visit: N/A   Request for Surgical Clearance    Procedure:  Colonoscopy  Date of Surgery:  Clearance 11/14/24                                Surgeon:  Dr. Evalene Misenheimer Surgeon's Group or Practice Name:   [facility]  Phone number:  [phone] Fax number:  [phone]   Type of Clearance Requested:   - Pharmacy:  Hold Aspirin  please advise   Type of Anesthesia:  Not Indicated   Additional requests/questions:    [name]   09/30/2024, 3:52 PM

## 2024-11-17 ENCOUNTER — Telehealth: Payer: Self-pay

## 2024-11-17 NOTE — Telephone Encounter (Signed)
 Patient called for refill on  orgovyx. This is not something we have prescribed in the past and he voiced the provider who had refilled it in the past is not willing to refill. Patient sees Dr Paleremo and he is not here today. Woodie Kapur RN radiation nurse aware and will address.

## 2024-11-18 ENCOUNTER — Telehealth: Payer: Self-pay

## 2024-11-18 NOTE — Telephone Encounter (Signed)
 I called [facility] at [phone] and spoke with Jabin, he stated that the prescription was up for refill but there was a problem with the claim and set me to [facility].  [facility] then explained that an enrollment form with [employer] at [phone] need to be completed over the phone with the member for insurance purposes.  Patient aware and phone number given.

## 2024-11-18 NOTE — Telephone Encounter (Signed)
-----   Message from Kaitlyn M Schomburg sent at 11/17/2024  3:24 PM EST ----- Regarding: RE: Orgovoyx Looks like pt has been receiving Orgovyx from [facility]. So if you can call them and see if there is anything they need. I do not know about any application they would need  Katie ----- Message ----- From: Sheron Falling, RN Sent: 11/17/2024   3:19 PM EST To: Kaitlyn M Schomburg, RPH-CPP Subject: Orgovoyx                                       Dr. Jomarie saw this patient for prostate radiation.  He had been prescribed Orgovyx from his previous urologist.  He states that he needs a refill and the pharmacy that had been sending it to him states that he needs an application completed.  Do you have any idea on what I can do for him?    Brad Hamilton

## 2024-12-09 ENCOUNTER — Other Ambulatory Visit: Payer: Self-pay

## 2024-12-09 DIAGNOSIS — C61 Malignant neoplasm of prostate: Secondary | ICD-10-CM

## 2024-12-09 MED ORDER — TADALAFIL 5 MG PO TABS: 5.0000 mg | ORAL_TABLET | Freq: Every day | ORAL | 0 refills | Status: AC | PRN
